# Patient Record
Sex: Male | Born: 1960 | Race: Black or African American | Hispanic: No | Marital: Married | Smoking: Never smoker
Health system: Southern US, Community
[De-identification: ages and names within clinical notes are randomized; demographics above are authoritative.]

## PROBLEM LIST (undated history)

## (undated) DIAGNOSIS — I509 Heart failure, unspecified: Secondary | ICD-10-CM

## (undated) DIAGNOSIS — E7800 Pure hypercholesterolemia, unspecified: Secondary | ICD-10-CM

## (undated) DIAGNOSIS — I10 Essential (primary) hypertension: Secondary | ICD-10-CM

## (undated) DIAGNOSIS — E119 Type 2 diabetes mellitus without complications: Secondary | ICD-10-CM

## (undated) HISTORY — PX: NO PAST SURGERIES: SHX2092

---
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
## 2009-05-10 ENCOUNTER — Ambulatory Visit (HOSPITAL_COMMUNITY): Admission: RE | Admit: 2009-05-10 | Discharge: 2009-05-10 | Payer: Self-pay | Admitting: Gastroenterology

## 2015-11-30 ENCOUNTER — Other Ambulatory Visit: Payer: Self-pay | Admitting: Gastroenterology

## 2016-01-01 ENCOUNTER — Encounter (HOSPITAL_COMMUNITY): Payer: Self-pay | Admitting: *Deleted

## 2016-01-03 ENCOUNTER — Encounter (HOSPITAL_COMMUNITY): Payer: Self-pay | Admitting: *Deleted

## 2016-01-06 ENCOUNTER — Encounter (HOSPITAL_COMMUNITY): Payer: Self-pay | Admitting: Anesthesiology

## 2016-01-06 NOTE — Anesthesia Preprocedure Evaluation (Addendum)
Anesthesia Evaluation  Patient identified by MRN, date of birth, ID band Patient awake    Reviewed: Allergy & Precautions, NPO status , Patient's Chart, lab work & pertinent test results  Airway Mallampati: III       Dental no notable dental hx. (+) Teeth Intact   Pulmonary  Snores, Daytime somnolence- ?undiagnosed OSA   Pulmonary exam normal breath sounds clear to auscultation       Cardiovascular hypertension, Pt. on medications Normal cardiovascular exam Rhythm:Regular Rate:Normal     Neuro/Psych negative neurological ROS  negative psych ROS   GI/Hepatic Neg liver ROS, Baseline screening colonoscopy   Endo/Other  diabetes, Well Controlled, Type 2, Oral Hypoglycemic AgentsHyperlipidemia  Renal/GU negative Renal ROS  negative genitourinary   Musculoskeletal negative musculoskeletal ROS (+)   Abdominal (+) + obese,   Peds  Hematology negative hematology ROS (+)   Anesthesia Other Findings   Reproductive/Obstetrics                            Anesthesia Physical Anesthesia Plan  ASA: II  Anesthesia Plan: MAC   Post-op Pain Management:    Induction: Intravenous  Airway Management Planned: Natural Airway, Nasal Cannula and Simple Face Mask  Additional Equipment:   Intra-op Plan:   Post-operative Plan:   Informed Consent: I have reviewed the patients History and Physical, chart, labs and discussed the procedure including the risks, benefits and alternatives for the proposed anesthesia with the patient or authorized representative who has indicated his/her understanding and acceptance.   Dental advisory given  Plan Discussed with: Anesthesiologist, CRNA and Surgeon  Anesthesia Plan Comments:        Anesthesia Quick Evaluation

## 2016-01-07 ENCOUNTER — Encounter (HOSPITAL_COMMUNITY): Payer: Self-pay

## 2016-01-07 ENCOUNTER — Ambulatory Visit (HOSPITAL_COMMUNITY)
Admission: RE | Admit: 2016-01-07 | Discharge: 2016-01-07 | Disposition: A | Discharge status: Home / Self Care | Payer: BC Managed Care – PPO | Source: Ambulatory Visit | Attending: Gastroenterology | Admitting: Gastroenterology

## 2016-01-07 ENCOUNTER — Ambulatory Visit (HOSPITAL_COMMUNITY): Payer: BC Managed Care – PPO | Admitting: Anesthesiology

## 2016-01-07 ENCOUNTER — Encounter (HOSPITAL_COMMUNITY)
Admission: RE | Disposition: A | Discharge status: Home / Self Care | Payer: Self-pay | Source: Ambulatory Visit | Attending: Gastroenterology

## 2016-01-07 DIAGNOSIS — Z1211 Encounter for screening for malignant neoplasm of colon: Secondary | ICD-10-CM | POA: Insufficient documentation

## 2016-01-07 DIAGNOSIS — E669 Obesity, unspecified: Secondary | ICD-10-CM | POA: Insufficient documentation

## 2016-01-07 DIAGNOSIS — Z6836 Body mass index (BMI) 36.0-36.9, adult: Secondary | ICD-10-CM | POA: Diagnosis not present

## 2016-01-07 DIAGNOSIS — E119 Type 2 diabetes mellitus without complications: Secondary | ICD-10-CM | POA: Insufficient documentation

## 2016-01-07 DIAGNOSIS — I10 Essential (primary) hypertension: Secondary | ICD-10-CM | POA: Diagnosis not present

## 2016-01-07 DIAGNOSIS — Z79899 Other long term (current) drug therapy: Secondary | ICD-10-CM | POA: Diagnosis not present

## 2016-01-07 DIAGNOSIS — Z7984 Long term (current) use of oral hypoglycemic drugs: Secondary | ICD-10-CM | POA: Diagnosis not present

## 2016-01-07 DIAGNOSIS — E7800 Pure hypercholesterolemia, unspecified: Secondary | ICD-10-CM | POA: Insufficient documentation

## 2016-01-07 DIAGNOSIS — Z7982 Long term (current) use of aspirin: Secondary | ICD-10-CM | POA: Diagnosis not present

## 2016-01-07 DIAGNOSIS — Z86010 Personal history of colonic polyps: Secondary | ICD-10-CM | POA: Insufficient documentation

## 2016-01-07 HISTORY — PX: COLONOSCOPY WITH PROPOFOL: SHX5780

## 2016-01-07 HISTORY — DX: Essential (primary) hypertension: I10

## 2016-01-07 HISTORY — DX: Type 2 diabetes mellitus without complications: E11.9

## 2016-01-07 HISTORY — DX: Pure hypercholesterolemia, unspecified: E78.00

## 2016-01-07 LAB — GLUCOSE, CAPILLARY: GLUCOSE-CAPILLARY: 137 mg/dL — AB (ref 65–99)

## 2016-01-07 SURGERY — COLONOSCOPY WITH PROPOFOL
Anesthesia: Monitor Anesthesia Care

## 2016-01-07 MED ORDER — PROPOFOL 10 MG/ML IV BOLUS
INTRAVENOUS | Status: AC
  Filled 2016-01-07: qty 40

## 2016-01-07 MED ORDER — SODIUM CHLORIDE 0.9 % IV SOLN: INTRAVENOUS | Status: DC

## 2016-01-07 MED ORDER — PROPOFOL 10 MG/ML IV BOLUS
INTRAVENOUS | Status: DC | PRN
  Administered 2016-01-07: 40 mg via INTRAVENOUS
  Administered 2016-01-07 (×7): 20 mg via INTRAVENOUS
  Administered 2016-01-07: 40 mg via INTRAVENOUS
  Administered 2016-01-07 (×6): 20 mg via INTRAVENOUS

## 2016-01-07 MED ORDER — LACTATED RINGERS IV SOLN
INTRAVENOUS | Status: DC
  Administered 2016-01-07: 1000 mL via INTRAVENOUS

## 2016-01-07 SURGICAL SUPPLY — 21 items

## 2016-01-07 NOTE — Discharge Instructions (Signed)

## 2016-01-07 NOTE — Transfer of Care (Signed)
Immediate Anesthesia Transfer of Care Note  Patient: Jeffrey Lewis  Procedure(s) Performed: Procedure(s): COLONOSCOPY WITH PROPOFOL (N/A)  Patient Location: PACU  Anesthesia Type:MAC  Level of Consciousness: sedated  Airway & Oxygen Therapy: Patient Spontanous Breathing and Patient connected to nasal cannula oxygen  Post-op Assessment: Report given to RN and Post -op Vital signs reviewed and stable  Post vital signs: Reviewed and stable  Last Vitals:  Vitals:   01/07/16 0622  BP: (!) 169/82  Pulse: 68  Resp: 19  Temp: 36.6 C    Last Pain:  Vitals:   01/07/16 0622  TempSrc: Oral         Complications: No apparent anesthesia complications

## 2016-01-07 NOTE — Op Note (Signed)
[facility] Patient Name: Jeffrey Lewis Procedure Date: 01/07/2016 MRN: [id number] Attending MD: Charolett Bumpers , MD Date of Birth: Apr 11, 1961 CSN: [id number] Age: 55 Admit Type: Outpatient Procedure:                Colonoscopy Indications:              High risk colon cancer surveillance: 05/10/2009                            colonoscopy was performed with removal of a 5 mm                            tubular adenomatous polyp Providers:                Charolett Bumpers, MD, Dwain Sarna, RN, [facility], Technician, Albertina Senegal. Alday CRNA, CRNA Referring MD:              Medicines:                Propofol per Anesthesia Complications:            No immediate complications. Estimated Blood Loss:     Estimated blood loss: none. Procedure:                Pre-Anesthesia Assessment:                           - Prior to the procedure, a History and Physical                            was performed, and patient medications and                            allergies were reviewed. The patient's tolerance of                            previous anesthesia was also reviewed. The risks                            and benefits of the procedure and the sedation                            options and risks were discussed with the patient.                            All questions were answered, and informed consent                            was obtained. Prior Anticoagulants: The patient                            last took aspirin 1 day prior to the procedure. ASA  Grade Assessment: II - A patient with mild systemic                            disease. After reviewing the risks and benefits,                            the patient was deemed in satisfactory condition to                            undergo the procedure.                           After obtaining informed consent, the colonoscope                            was  passed under direct vision. Throughout the                            procedure, the patient's blood pressure, pulse, and                            oxygen saturations were monitored continuously. The                            EC-[id number] ([id number]) scope was introduced through                            the anus and advanced to the the cecum, identified                            by appendiceal orifice and ileocecal valve. The                            colonoscopy was performed without difficulty. The                            patient tolerated the procedure well. The quality                            of the bowel preparation was good. The appendiceal                            orifice and the rectum were photographed. Scope In: 7:42:20 AM Scope Out: 7:57:50 AM Scope Withdrawal Time: 0 hours 11 minutes 1 second  Total Procedure Duration: 0 hours 15 minutes 30 seconds  Findings:      The perianal and digital rectal examinations were normal.      The entire examined colon appeared normal. Impression:               - The entire examined colon is normal.                           - No specimens collected. Moderate Sedation:      N/A- Per Anesthesia Care Recommendation:           -  Patient has a contact number available for                            emergencies. The signs and symptoms of potential                            delayed complications were discussed with the                            patient. Return to normal activities tomorrow.                            Written discharge instructions were provided to the                            patient.                           - Repeat colonoscopy in 5 years for surveillance.                           - Resume previous diet.                           - Continue present medications. Procedure Code(s):        --- Professional ---                           [id number], Colorectal cancer screening; colonoscopy on                             individual at high risk Diagnosis Code(s):        --- Professional ---                           Z86.010, Personal history of colonic polyps CPT copyright 2016 American Medical Association. All rights reserved. The codes documented in this report are preliminary and upon coder review may  be revised to meet current compliance requirements. Danise EdgeMartin Johnson, MD Charolett BumpersMartin K Johnson, MD 01/07/2016 8:03:59 AM This report has been signed electronically. Number of Addenda: 0

## 2016-01-07 NOTE — H&P (Signed)
  Procedure: Surveillance colonoscopy. 05/10/2009 colonoscopy was performed with removal of a 5 mm cecal tubular adenomatous polyp  History: The patient is a 55 year old male born 1960-08-31. He is scheduled to undergo a surveillance colonoscopy today.  Medication allergies: ACE inhibitors cause cough.  Past medical history: Type 2 diabetes mellitus. Hypercholesterolemia. Hypertension. Testosterone deficiency.  Exam: The patient is alert and lying comfortably on the endoscopy stretcher. Abdomen is soft and nontender to palpation. Cardiac exam reveals a regular rhythm. Lungs are clear to auscultation.  Plan: Proceed with surveillance colonoscopy

## 2016-01-07 NOTE — Anesthesia Postprocedure Evaluation (Signed)
Anesthesia Post Note  Patient: Jeffrey Lewis  Procedure(s) Performed: Procedure(s) (LRB): COLONOSCOPY WITH PROPOFOL (N/A)  Patient location during evaluation: PACU Anesthesia Type: MAC Level of consciousness: awake and alert and oriented Pain management: pain level controlled Vital Signs Assessment: post-procedure vital signs reviewed and stable Respiratory status: spontaneous breathing, nonlabored ventilation and respiratory function stable Cardiovascular status: stable and blood pressure returned to baseline Postop Assessment: no signs of nausea or vomiting Anesthetic complications: no    Last Vitals:  Vitals:   01/07/16 0622 01/07/16 0803  BP: (!) 169/82 (!) 149/97  Pulse: 68 82  Resp: 19 14  Temp: 36.6 C 36.4 C    Last Pain:  Vitals:   01/07/16 0803  TempSrc: Oral                 Ting Cage A.

## 2016-01-08 ENCOUNTER — Encounter (HOSPITAL_COMMUNITY): Payer: Self-pay | Admitting: Gastroenterology

## 2017-11-02 ENCOUNTER — Ambulatory Visit
Admission: RE | Admit: 2017-11-02 | Discharge: 2017-11-02 | Disposition: A | Discharge status: Home / Self Care | Payer: BC Managed Care – PPO | Source: Ambulatory Visit | Attending: Physician Assistant | Admitting: Physician Assistant

## 2017-11-02 ENCOUNTER — Other Ambulatory Visit: Payer: Self-pay | Admitting: Physician Assistant

## 2017-11-02 DIAGNOSIS — R600 Localized edema: Secondary | ICD-10-CM

## 2017-11-02 DIAGNOSIS — R05 Cough: Secondary | ICD-10-CM

## 2017-11-02 DIAGNOSIS — R059 Cough, unspecified: Secondary | ICD-10-CM

## 2017-11-02 DIAGNOSIS — R0602 Shortness of breath: Secondary | ICD-10-CM

## 2017-11-10 ENCOUNTER — Ambulatory Visit (HOSPITAL_COMMUNITY): Payer: BC Managed Care – PPO

## 2017-11-10 ENCOUNTER — Ambulatory Visit (HOSPITAL_COMMUNITY): Payer: BC Managed Care – PPO | Attending: Cardiovascular Disease

## 2017-11-10 ENCOUNTER — Other Ambulatory Visit: Payer: Self-pay

## 2017-11-10 ENCOUNTER — Encounter: Payer: Self-pay | Admitting: Cardiovascular Disease

## 2017-11-10 ENCOUNTER — Telehealth: Payer: Self-pay

## 2017-11-10 ENCOUNTER — Ambulatory Visit: Payer: BC Managed Care – PPO | Admitting: Cardiovascular Disease

## 2017-11-10 ENCOUNTER — Other Ambulatory Visit (HOSPITAL_COMMUNITY): Payer: Self-pay | Admitting: Family Medicine

## 2017-11-10 VITALS — BP 110/94 | HR 95 | Ht 71.0 in | Wt 273.0 lb

## 2017-11-10 DIAGNOSIS — I10 Essential (primary) hypertension: Secondary | ICD-10-CM | POA: Diagnosis not present

## 2017-11-10 DIAGNOSIS — R609 Edema, unspecified: Secondary | ICD-10-CM | POA: Insufficient documentation

## 2017-11-10 DIAGNOSIS — I5023 Acute on chronic systolic (congestive) heart failure: Secondary | ICD-10-CM | POA: Diagnosis not present

## 2017-11-10 LAB — HEPATIC FUNCTION PANEL
ALT: 47 IU/L — AB (ref 0–44)
AST: 24 IU/L (ref 0–40)
Albumin: 4.4 g/dL (ref 3.5–5.5)
Alkaline Phosphatase: 66 IU/L (ref 39–117)
Bilirubin Total: 0.7 mg/dL (ref 0.0–1.2)
Bilirubin, Direct: 0.22 mg/dL (ref 0.00–0.40)
TOTAL PROTEIN: 6.7 g/dL (ref 6.0–8.5)

## 2017-11-10 LAB — BASIC METABOLIC PANEL
BUN/Creatinine Ratio: 12 (ref 9–20)
BUN: 15 mg/dL (ref 6–24)
CO2: 20 mmol/L (ref 20–29)
Calcium: 9.3 mg/dL (ref 8.7–10.2)
Chloride: 105 mmol/L (ref 96–106)
Creatinine, Ser: 1.29 mg/dL — ABNORMAL HIGH (ref 0.76–1.27)
GFR calc non Af Amer: 61 mL/min/{1.73_m2} (ref >59)
GFR, EST AFRICAN AMERICAN: 70 mL/min/{1.73_m2} (ref >59)
Glucose: 162 mg/dL — ABNORMAL HIGH (ref 65–99)
POTASSIUM: 4.2 mmol/L (ref 3.5–5.2)
Sodium: 142 mmol/L (ref 134–144)

## 2017-11-10 LAB — LIPID PANEL
Chol/HDL Ratio: 4 ratio (ref 0.0–5.0)
Cholesterol, Total: 172 mg/dL (ref 100–199)
HDL: 43 mg/dL (ref >39)
LDL CALC: 110 mg/dL — AB (ref 0–99)
Triglycerides: 95 mg/dL (ref 0–149)
VLDL CHOLESTEROL CAL: 19 mg/dL (ref 5–40)

## 2017-11-10 LAB — CBC
HEMATOCRIT: 45.4 % (ref 37.5–51.0)
Hemoglobin: 15.1 g/dL (ref 13.0–17.7)
MCH: 29.6 pg (ref 26.6–33.0)
MCHC: 33.3 g/dL (ref 31.5–35.7)
MCV: 89 fL (ref 79–97)
Platelets: 218 10*3/uL (ref 150–450)
RBC: 5.1 x10E6/uL (ref 4.14–5.80)
RDW: 14.6 % (ref 12.3–15.4)
WBC: 5.2 10*3/uL (ref 3.4–10.8)

## 2017-11-10 LAB — TSH: TSH: 1.95 u[IU]/mL (ref 0.450–4.500)

## 2017-11-10 MED ORDER — POTASSIUM CHLORIDE ER 10 MEQ PO TBCR: 10.0000 meq | EXTENDED_RELEASE_TABLET | Freq: Every day | ORAL | 3 refills | Status: DC

## 2017-11-10 MED ORDER — TORSEMIDE 20 MG PO TABS: 40.0000 mg | ORAL_TABLET | Freq: Every day | ORAL | 11 refills | Status: DC

## 2017-11-10 MED ORDER — SACUBITRIL-VALSARTAN 24-26 MG PO TABS: 1.0000 | ORAL_TABLET | Freq: Two times a day (BID) | ORAL | 11 refills | Status: DC

## 2017-11-10 NOTE — Telephone Encounter (Signed)
I have done an [place] PA through covermymeds. Key: [id number]

## 2017-11-10 NOTE — Patient Instructions (Addendum)
Medication Instructions:  Your physician has recommended you make the following change in your medication:   STOP Amlodipine (Norvasc) STOP Furosemide (Lasix) START Torsemide (Demadex) 40 mg once daily - take in the morning START Entresto (Valsartan/Sacubitril) 24-26 mg twice daily - morning and evening START Kdur (potassium supplement) 10 mEq once daily - take in the morning   Labwork: TODAY - BMET, CBC, TSH, Liver panel, cholesterol  Your physician recommends that you return for lab work at next appointment - BMET. You do not have to fast for this appointment.   Testing/Procedures: None Ordered   Follow-Up: Your physician recommends that you return for a follow-up appointment on Tuesday July 30 at 10:40 am    If you need a refill on your cardiac medications before your next appointment, please call your pharmacy.   Thank you for choosing [facility]! Eligha Bridegroom, RN [phone]   Heart Failure Action Plan A heart failure action plan helps you understand what to do when you have symptoms of heart failure. Follow the plan that was created by you and your health care provider. Review your plan each time you visit your health care provider. Red zone These signs and symptoms mean you should get medical help right away:  You have trouble breathing when resting.  You have a dry cough that is getting worse.  You have swelling or pain in your legs or abdomen that is getting worse.  You suddenly gain more than 2-3 lb (0.9-1.4 kg) in a day, or more than 5 lb (2.3 kg) in one week. This amount may be more or less depending on your condition.  You have trouble staying awake or you feel confused.  You have chest pain.  You do not have an appetite.  You pass out.  If you experience any of these symptoms:  Call your local emergency services (911 in the [place] right away or seek help at the emergency department of the nearest hospital.  Yellow zone These signs and  symptoms mean your condition may be getting worse and you should make some changes:  You have trouble breathing when you are active or you need to sleep with extra pillows.  You have swelling in your legs or abdomen.  You gain 2-3 lb (0.9-1.4 kg) in one day, or 5 lb (2.3 kg) in one week. This amount may be more or less depending on your condition.  You get tired easily.  You have trouble sleeping.  You have a dry cough.  If you experience any of these symptoms:  Contact your health care provider within the next day.  Your health care provider may adjust your medicines.  Green zone These signs mean you are doing well and can continue what you are doing:  You do not have shortness of breath.  You have very little swelling or no new swelling.  Your weight is stable (no gain or loss).  You have a normal activity level.  You do not have chest pain or any other new symptoms.  Follow these instructions at home:  Take over-the-counter and prescription medicines only as told by your health care provider.  Weigh yourself daily. Your target weight is __________ lb (__________ kg). ? Call your health care provider if you gain more than __________ lb (__________ kg) in a day, or more than __________ lb (__________ kg) in one week.  Eat a heart-healthy diet. Work with a diet and nutrition specialist (dietitian) to create an eating plan that is best for  you.  Keep all follow-up visits as told by your health care provider. This is important. Where to find more information:  American Heart Association: www.heart.org Summary  Follow the action plan that was created by you and your health care provider.  Get help right away if you have any symptoms in the Red zone. This information is not intended to replace advice given to you by your health care provider. Make sure you discuss any questions you have with your health care provider. Document Released: 05/24/2016 Document Revised:  05/24/2016 Document Reviewed: 05/24/2016 Elsevier Interactive Patient Education  [employer].

## 2017-11-10 NOTE — Progress Notes (Signed)
Cardiology Office Note:    Date:  11/10/2017   ID:  Jeffrey Lewis, DOB 08/12/1960, MRN [id number]  PCP:  Blair Heys, MD  Cardiologist:  Kristeen Miss, MD   Referring MD: Blair Heys, MD   Chief Complaint  Patient presents with  . Congestive Heart Failure     History of Present Illness:    Jeffrey Lewis is a 57 y.o. male with a hx of hypertension, obesity, hyperlipidemia.  He had episodes of shortness of breath that seem to be worsening.  He was scheduled for an echocardiogram.  The echo tech noticed that his ejection fraction was markedly reduced and he was worked into my schedule for further evaluation.  Several months of progressive dyspnea.     Worse several weeks ago  Developed a cough.  Saw [name] who ordered an echo  Has ocasional 2 pillow orthopnea.     Has PND  Significant DOE . 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  On meds for several years.  Still eats some salty foods.   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  He does not know if he has apneic episodes. Does not have early morning somnolence.  Is have some sleepiness associated with some narcotic cough syrup that he received recently.  Was just started on Furosemide   A day  - doesn't seem to be helping much    Past Medical History:  Diagnosis Date  . Diabetes mellitus without complication (HCC)   . Elevated cholesterol   . Hypertension     Past Surgical History:  Procedure Laterality Date  . COLONOSCOPY WITH PROPOFOL N/A 01/07/2016   Procedure: COLONOSCOPY WITH PROPOFOL;  Surgeon: Charolett Bumpers, MD;  Location: [facility] END[facility]COPY;  Service: Endoscopy;  Laterality: N/A;  . NO PAST SURGERIES       Current Medications: Current Meds  Medication Sig  . aspirin EC 81 MG tablet Take 81 mg by mouth every evening.  Marland Kitchen atorvastatin (LIPITOR) 40 MG tablet Take 40 mg by mouth every evening.  . metFORMIN (GLUCOPHAGE) 500 MG tablet Take by mouth 2 (two) times daily with a meal.  . UNABLE TO FIND ploglitazome 30 mg daily in pm  . [DISCONTINUED] amLODipine (NORVASC) 5 MG tablet Take 5 mg by mouth every evening.     Allergies:   Patient has no allergy information on record.   Social History   Socioeconomic History  . Marital status: Married    Spouse name: Not on file  . Number of children: Not on file  . Years of education: Not on file  . Highest education level: Not on file  Occupational History  . Not on file  Social Needs  . Financial resource strain: Not on file  . Food insecurity:    Worry: Not on file    Inability: Not on file  . Transportation needs:    Medical: Not on file    Non-medical: Not on file  Tobacco Use  . Smoking status: Never Smoker  . Smokeless tobacco: Never Used  Substance and Sexual Activity  . Alcohol use: Yes    Comment: 1 beer  per day  . Drug use: No  . Sexual activity: Yes  Lifestyle  . Physical activity:    Days per week: Not on file    Minutes per session: Not on file  . Stress: Not on file  Relationships  . Social connections:    Talks on phone: Not on file    Gets together: Not on file    Attends religious service: Not on file    Active member of club or organization: Not on file    Attends meetings of clubs or organizations: Not on file    Relationship status: Not on file  Other Topics Concern  . Not on file  Social History Narrative  . Not on file     Family History: The patient's family history includes CAD in his brother; Hypertension in his father and mother.  ROS:   Please see the history of present illness.     All other systems reviewed and are negative.  EKGs/Labs/Other Studies Reviewed:    The following studies  were reviewed today:    EKG:   November 10, 2017.   NSR at 95,  1st degree AV block pulmonary disease pattern   Recent Labs: 11/10/2017: ALT 47; BUN 15; Creatinine, Ser 1.29; Hemoglobin 15.1; Platelets 218; Potassium 4.2; Sodium 142; TSH 1.950  Recent Lipid Panel    Component Value Date/Time   CHOL 172 11/10/2017 1153   TRIG 95 11/10/2017 1153   HDL 43 11/10/2017 1153   CHOLHDL 4.0 11/10/2017 1153   LDLCALC 110 (H) 11/10/2017 1153    Physical Exam:    VS:  BP (!) 110/94   Pulse 95   Ht 5\' 11"  (1.803 m)   Wt 273 lb (123.8 kg)   SpO2 97%   BMI 38.08 kg/m     Wt Readings from Last 3 Encounters:  11/10/17 273 lb (123.8 kg)  01/07/16 260 lb (117.9 kg)     GEN:   Middle age male,  NAD ,   obese HEENT: Normal NECK: No JVD; No carotid bruits LYMPHATICS: No lymphadenopathy CARDIAC:   RR , S1 S2, + S3  No significant murmur  RESPIRATORY:  Clear to auscultation without rales, wheezing or rhonchi  ABDOMEN: Soft, non-tender, non-distended MUSCULOSKELETAL:  No edema; No deformity  SKIN: Warm and dry NEUROLOGIC:  Alert and oriented x 3 PSYCHIATRIC:  Normal affect   ASSESSMENT:    1. Acute on chronic systolic heart failure (HCC)   2. Essential hypertension    PLAN:    In order of problems listed above:  1. Acute on chronic combined CHF:   Jeffrey Lewis presents with several months of progressive heart failure symptoms.  Echocardiogram today shows an ejection fraction of around 15%.  His blood pressure has been fairly well-controlled recently.  He is currently on amlodipine.  We will discontinue the amlodipine and start him on Entresto 24-26 mg twice a day.  Discontinue the furosemide and start him on torsemide 40 mg a day.  We will check labs today including basic metabolic profile, TSH, CBC, liver enzymes, lipid profile.  I will see him back in 1/2 weeks for follow-up visit.  We will check a basic metabolic profile at that time.  Advised him to weigh himself every day.  2.  HTN:   He  was found to have acute on chronic combined systolic and diastolic congestive heart failure.  We will discontinue the amlodipine we will start him on Entresto 24--26 mg twice a day.  3.  Diabetes mellitus: His fasting glucose level at his primary care's office was around 250.  He needs to pay better attention to his diet.  He may be a good candidate for Jardiance.   Medication Adjustments/Labs and Tests Ordered: Current medicines are reviewed at length with the patient today.  Concerns regarding medicines are outlined above.  Orders Placed This Encounter  Procedures  . CBC  . Basic Metabolic Panel (BMET)  . Lipid Profile  . Hepatic function panel  . TSH  . EKG 12-Lead   Meds ordered this encounter  Medications  . sacubitril-valsartan (ENTRESTO) 24-26 MG    Sig: Take 1 tablet by mouth 2 (two) times daily.    Dispense:  60 tablet    Refill:  11  . torsemide (DEMADEX) 20 MG tablet    Sig: Take 2 tablets (40 mg total) by mouth daily.    Dispense:  60 tablet    Refill:  11  . potassium chloride (K-DUR) 10 MEQ tablet    Sig: Take 1 tablet (10 mEq total) by mouth daily.    Dispense:  90 tablet    Refill:  3     Patient Instructions  Medication Instructions:  Your physician has recommended you make the following change in your medication:   STOP Amlodipine (Norvasc) STOP Furosemide (Lasix) START Torsemide (Demadex) 40 mg once daily - take in the morning START Entresto (Valsartan/Sacubitril) 24-26 mg twice daily - morning and evening START Kdur (potassium supplement) 10 mEq once daily - take in the morning   Labwork: TODAY - BMET, CBC, TSH, Liver panel, cholesterol  Your physician recommends that you return for lab work at next appointment - BMET. You do not have to fast for this appointment.   Testing/Procedures: None Ordered   Follow-Up: Your physician recommends that you return for a follow-up appointment on Tuesday July 30 at 10:40 am    If you need a refill on  your cardiac medications before your next appointment, please call your pharmacy.   Thank you for choosing [facility]! Eligha Bridegroom, RN [phone]   Heart Failure Action Plan A heart failure action plan helps you understand what to do when you have symptoms of heart failure. Follow the plan that was created by you and your health care provider. Review your plan each time you visit your health care provider. Red zone These signs and symptoms mean you should get medical help right away:  You have trouble breathing when resting.  You have a dry cough that is getting worse.  You have swelling or pain in your legs or abdomen that is getting worse.  You suddenly gain more than 2-3 lb (0.9-1.4 kg) in a day, or more than 5 lb (2.3 kg) in one week. This amount may be more or less depending on your condition.  You have trouble staying awake or you feel confused.  You have chest pain.  You do not have an appetite.  You pass out.  If you experience any of these symptoms:  Call your local emergency services (911 in the [place] right away or seek help at the emergency department of the nearest hospital.  Yellow zone These signs and symptoms mean your condition may be getting worse and you should make some changes:  You have trouble breathing when you are active or you need to sleep with extra pillows.  You have swelling in your legs or abdomen.  You gain 2-3 lb (0.9-1.4 kg) in one day, or 5 lb (2.3  kg) in one week. This amount may be more or less depending on your condition.  You get tired easily.  You have trouble sleeping.  You have a dry cough.  If you experience any of these symptoms:  Contact your health care provider within the next day.  Your health care provider may adjust your medicines.  Green zone These signs mean you are doing well and can continue what you are doing:  You do not have shortness of breath.  You have very little swelling or no new  swelling.  Your weight is stable (no gain or loss).  You have a normal activity level.  You do not have chest pain or any other new symptoms.  Follow these instructions at home:  Take over-the-counter and prescription medicines only as told by your health care provider.  Weigh yourself daily. Your target weight is __________ lb (__________ kg). ? Call your health care provider if you gain more than __________ lb (__________ kg) in a day, or more than __________ lb (__________ kg) in one week.  Eat a heart-healthy diet. Work with a diet and nutrition specialist (dietitian) to create an eating plan that is best for you.  Keep all follow-up visits as told by your health care provider. This is important. Where to find more information:  American Heart Association: www.heart.org Summary  Follow the action plan that was created by you and your health care provider.  Get help right away if you have any symptoms in the Red zone. This information is not intended to replace advice given to you by your health care provider. Make sure you discuss any questions you have with your health care provider. Document Released: 05/24/2016 Document Revised: 05/24/2016 Document Reviewed: 05/24/2016 Elsevier Interactive Patient Education  2018 [employer].      Signed, Kristeen Miss, MD  11/10/2017 6:20 PM    [facility]

## 2017-11-11 NOTE — Telephone Encounter (Signed)
Letter received via fax from [facility] stating that they have approved the pts [place] PA. Approval good from 11/10/2017 until 11/11/2018.  I have notified the pts pharmacy.

## 2017-11-24 ENCOUNTER — Ambulatory Visit: Payer: BC Managed Care – PPO | Admitting: Cardiovascular Disease

## 2017-11-24 ENCOUNTER — Encounter: Payer: Self-pay | Admitting: Cardiovascular Disease

## 2017-11-24 ENCOUNTER — Encounter (INDEPENDENT_AMBULATORY_CARE_PROVIDER_SITE_OTHER): Payer: Self-pay

## 2017-11-24 VITALS — BP 118/80 | HR 97 | Ht 71.0 in | Wt 266.4 lb

## 2017-11-24 DIAGNOSIS — I5043 Acute on chronic combined systolic (congestive) and diastolic (congestive) heart failure: Secondary | ICD-10-CM

## 2017-11-24 LAB — BASIC METABOLIC PANEL
BUN / CREAT RATIO: 17 (ref 9–20)
BUN: 24 mg/dL (ref 6–24)
CO2: 22 mmol/L (ref 20–29)
CREATININE: 1.38 mg/dL — AB (ref 0.76–1.27)
Calcium: 9.3 mg/dL (ref 8.7–10.2)
Chloride: 100 mmol/L (ref 96–106)
GFR, EST AFRICAN AMERICAN: 65 mL/min/{1.73_m2} (ref >59)
GFR, EST NON AFRICAN AMERICAN: 56 mL/min/{1.73_m2} — AB (ref >59)
Glucose: 215 mg/dL — ABNORMAL HIGH (ref 65–99)
Potassium: 3.9 mmol/L (ref 3.5–5.2)
Sodium: 140 mmol/L (ref 134–144)

## 2017-11-24 MED ORDER — TORSEMIDE 20 MG PO TABS: 20.0000 mg | ORAL_TABLET | Freq: Every day | ORAL | 3 refills | Status: DC

## 2017-11-24 MED ORDER — CARVEDILOL 3.125 MG PO TABS: 3.1250 mg | ORAL_TABLET | Freq: Two times a day (BID) | ORAL | 11 refills | Status: DC

## 2017-11-24 NOTE — Patient Instructions (Addendum)
Medication Instructions:  Your physician has recommended you make the following change in your medication:   DECREASE Torsemide (Demadex) to 20 mg once daily START Carvedilol (Coreg) 3.125 mg twice daily - take 12 hours apart   Labwork: TODAY - basic metabolic panel   Testing/Procedures: None Ordered   Follow-Up: Your physician recommends that you return for a follow-up appointment on Tuesday August 27 at 10:20    If you need a refill on your cardiac medications before your next appointment, please call your pharmacy.   Thank you for choosing [facility]! Eligha Bridegroom, RN [phone]

## 2017-11-24 NOTE — Progress Notes (Signed)
Cardiology Office Note:    Date:  11/24/2017   ID:  Jeffrey Lewis, DOB Aug 19, 1960, MRN [id number]  PCP:  Blair Heys, MD  Cardiologist:  Kristeen Miss, MD   Referring MD: Blair Heys, MD   Chief Complaint  Patient presents with  . Congestive Heart Failure     History of Present Illness:    Jeffrey Lewis is a 57 y.o. male with a hx of hypertension, obesity, hyperlipidemia.  He had episodes of shortness of breath that seem to be worsening.  He was scheduled for an echocardiogram.  The echo tech noticed that his ejection fraction was markedly reduced and he was worked into my schedule for further evaluation.  Several months of progressive dyspnea.     Worse several weeks ago  Developed a cough.  Saw [name] who ordered an echo  Has ocasional 2 pillow orthopnea.     Has PND  Significant DOE . 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  On meds for several years.  Still eats some salty foods.   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  He does not know if he has apneic episodes. Does not have early morning somnolence.  Is have some sleepiness associated with some narcotic cough syrup that he received recently.  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  He has been on torsemide.  We started [facility] during his last office visit.  Does not eat much salty foods.   Leg swelling has resolved.   Been found to have an elevated PSA level.  There is a chance that he will need a prostate biopsy.  Wt today = 266 Wt on JUly 16 was 273  lbs.   Past Medical History:  Diagnosis Date  . Diabetes mellitus without complication (HCC)   . Elevated cholesterol   . Hypertension     Past Surgical History:  Procedure Laterality Date  . COLONOSCOPY WITH PROPOFOL N/A 01/07/2016   Procedure: COLONOSCOPY WITH PROPOFOL;  Surgeon: Charolett Bumpers, MD;  Location: [facility];  Service: Endoscopy;  Laterality: N/A;  . NO PAST SURGERIES      Current Medications: Current Meds  Medication Sig  . aspirin EC 81 MG tablet Take 81 mg by mouth every evening.  Marland Kitchen atorvastatin (LIPITOR) 40 MG tablet Take 40 mg by mouth every evening.  . metFORMIN (GLUCOPHAGE) 500 MG tablet Take by mouth 2 (two) times daily with a meal.  . potassium chloride (K-DUR) 10 MEQ tablet Take 1 tablet (10 mEq total) by mouth daily.  . sacubitril-valsartan (ENTRESTO) 24-26 MG Take 1 tablet by mouth 2 (two) times daily.  Marland Kitchen UNABLE TO FIND ploglitazome 30 mg daily in pm  . [DISCONTINUED] torsemide (DEMADEX) 20 MG tablet Take 2 tablets (40 mg total) by mouth daily.     Allergies:   Patient has no allergy information on record.   Social History   Socioeconomic History  . Marital status: Married  Spouse name: Not on file  . Number of children: Not on file  . Years of education: Not on file  . Highest education level: Not on file  Occupational History  . Not on file  Social Needs  . Financial resource strain: Not on file  . Food insecurity:    Worry: Not on file    Inability: Not on file  . Transportation needs:    Medical: Not on file    Non-medical: Not on file  Tobacco Use  . Smoking status: Never Smoker  . Smokeless tobacco: Never Used  Substance and Sexual Activity  . Alcohol use: Yes    Comment: 1 beer per day  . Drug use: No  . Sexual activity: Yes  Lifestyle  . Physical activity:    Days per week: Not on file    Minutes per session: Not on file  . Stress: Not on file  Relationships  . Social connections:    Talks on phone: Not on file     Gets together: Not on file    Attends religious service: Not on file    Active member of club or organization: Not on file    Attends meetings of clubs or organizations: Not on file    Relationship status: Not on file  Other Topics Concern  . Not on file  Social History Narrative  . Not on file     Family History: The patient's family history includes CAD in his brother; Hypertension in his father and mother.  ROS:   Please see the history of present illness.     All other systems reviewed and are negative.  EKGs/Labs/Other Studies Reviewed:    The following studies were reviewed today:    EKG:      Recent Labs: 11/10/2017: ALT 47; BUN 15; Creatinine, Ser 1.29; Hemoglobin 15.1; Platelets 218; Potassium 4.2; Sodium 142; TSH 1.950  Recent Lipid Panel    Component Value Date/Time   CHOL 172 11/10/2017 1153   TRIG 95 11/10/2017 1153   HDL 43 11/10/2017 1153   CHOLHDL 4.0 11/10/2017 1153   LDLCALC 110 (H) 11/10/2017 1153    Physical Exam:     Physical Exam: Blood pressure 118/80, pulse 97, height 5\' 11"  (1.803 m), weight 266 lb 6.4 oz (120.8 kg), SpO2 97 %.  GEN:  Well nourished, well developed in no acute distress HEENT: Normal NECK: No JVD; No carotid bruits LYMPHATICS: No lymphadenopathy CARDIAC: RR, no murmurs, rubs, gallops RESPIRATORY:  Clear to auscultation without rales, wheezing or rhonchi  ABDOMEN: Soft, non-tender, non-distended MUSCULOSKELETAL:  No edema; No deformity  SKIN: Warm and dry NEUROLOGIC:  Alert and oriented x 3   ASSESSMENT:    1. Acute on chronic combined systolic and diastolic CHF (congestive heart failure) (HCC)    PLAN:    In order of problems listed above:  Acute on chronic combined CHF:    [name] is feeling much better.  He is diuresed 7 pounds over the past 2 weeks.  He is tolerating the Entresto fairly well.  His heart rate is on the higher end of normal.  We will start him on carvedilol 3.125 mg twice a day.  We will decrease  the torsemide to 20 mg a day.  We will check a basic metabolic profile today.  I will see him again in 3 to 4 weeks and will check a basic metabolic profile at that time.  Anticipate increasing the Entresto at that time.   3.  Diabetes mellitus: His fasting glucose level at his primary care's office was around 250.  He needs to pay better attention to his diet.  He may be a good candidate for Jardiance.   Medication Adjustments/Labs and Tests Ordered: Current medicines are reviewed at length with the patient today.  Concerns regarding medicines are outlined above.  Orders Placed This Encounter  Procedures  . Basic Metabolic Panel (BMET)   Meds ordered this encounter  Medications  . carvedilol (COREG) 3.125 MG tablet    Sig: Take 1 tablet (3.125 mg total) by mouth 2 (two) times daily.    Dispense:  60 tablet    Refill:  11  . torsemide (DEMADEX) 20 MG tablet    Sig: Take 1 tablet (20 mg total) by mouth daily.    Dispense:  90 tablet    Refill:  3     Patient Instructions  Medication Instructions:  Your physician has recommended you make the following change in your medication:   DECREASE Torsemide (Demadex) to 20 mg once daily START Carvedilol (Coreg) 3.125 mg twice daily - take 12 hours apart   Labwork: TODAY - basic metabolic panel   Testing/Procedures: None Ordered   Follow-Up: Your physician recommends that you return for a follow-up appointment on Tuesday August 27 at 10:20    If you need a refill on your cardiac medications before your next appointment, please call your pharmacy.   Thank you for choosing [facility]! Eligha Bridegroom, RN [phone]       Signed, Kristeen Miss, MD  11/24/2017 11:53 AM    [facility]

## 2017-12-11 DIAGNOSIS — E119 Type 2 diabetes mellitus without complications: Secondary | ICD-10-CM | POA: Insufficient documentation

## 2017-12-22 ENCOUNTER — Encounter (INDEPENDENT_AMBULATORY_CARE_PROVIDER_SITE_OTHER): Payer: Self-pay

## 2017-12-22 ENCOUNTER — Ambulatory Visit: Payer: BC Managed Care – PPO | Admitting: Cardiovascular Disease

## 2017-12-22 ENCOUNTER — Encounter: Payer: Self-pay | Admitting: Cardiovascular Disease

## 2017-12-22 VITALS — BP 114/72 | HR 88 | Ht 71.0 in | Wt 268.0 lb

## 2017-12-22 DIAGNOSIS — I5043 Acute on chronic combined systolic (congestive) and diastolic (congestive) heart failure: Secondary | ICD-10-CM

## 2017-12-22 LAB — BASIC METABOLIC PANEL
BUN / CREAT RATIO: 15 (ref 9–20)
BUN: 22 mg/dL (ref 6–24)
CO2: 23 mmol/L (ref 20–29)
CREATININE: 1.49 mg/dL — AB (ref 0.76–1.27)
Calcium: 9.7 mg/dL (ref 8.7–10.2)
Chloride: 96 mmol/L (ref 96–106)
GFR, EST AFRICAN AMERICAN: 59 mL/min/{1.73_m2} — AB (ref >59)
GFR, EST NON AFRICAN AMERICAN: 51 mL/min/{1.73_m2} — AB (ref >59)
GLUCOSE: 202 mg/dL — AB (ref 65–99)
Potassium: 4 mmol/L (ref 3.5–5.2)
SODIUM: 137 mmol/L (ref 134–144)

## 2017-12-22 MED ORDER — CARVEDILOL 6.25 MG PO TABS: 6.2500 mg | ORAL_TABLET | Freq: Two times a day (BID) | ORAL | 3 refills | Status: DC

## 2017-12-22 NOTE — Patient Instructions (Addendum)
Medication Instructions:  1) INCREASE Carvedilol to 6.25mg  twice daily  Labwork: BMET today  Testing/Procedures: None  Follow-Up: Your physician recommends that you schedule a follow-up appointment in: 6 weeks with Dr. Elease Hashimoto. (Can have 10/4 at 11:40A)   Any Other Special Instructions Will Be Listed Below (If Applicable).     If you need a refill on your cardiac medications before your next appointment, please call your pharmacy.

## 2017-12-22 NOTE — Progress Notes (Signed)
Cardiology Office Note:    Date:  12/22/2017   ID:  Jeffrey Lewis, DOB 1961-01-03, MRN [id number]  PCP:  [place], MD  Cardiologist:  Kristeen Miss, MD   Referring MD: Blair Heys, MD   Chief Complaint  Patient presents with  . Congestive Heart Failure         Jeffrey Lewis is a 57 y.o. male with a hx of hypertension, obesity, hyperlipidemia.  He had episodes of shortness of breath that seem to be worsening.  He was scheduled for an echocardiogram.  The echo tech noticed that his ejection fraction was markedly reduced and he was worked into my schedule for further evaluation.  Several months of progressive dyspnea.     Worse several weeks ago  Developed a cough.  Saw [name] who ordered an echo  Has ocasional 2 pillow orthopnea.     Has PND  Significant DOE . 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  On meds for several years.  Still eats some salty foods.   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  He does not know if he has apneic episodes. Does not have early morning somnolence.  Is have some sleepiness associated with some narcotic cough syrup that he received recently.  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  He has been on torsemide.  We started [facility] during his last office visit.  Does not eat much salty foods.   Leg swelling has resolved.   Been found to have an elevated PSA level.  There is a chance that he will need a prostate biopsy.  Wt today = 266 Wt on JUly 16 was 273 lbs.   December 22, 2017:  Jeffrey Lewis seen today for follow-up of his acute on chronic combined systolic and diastolic congestive heart failure. His weight today is 268 pounds.  He is tolerating Entresto fairly well.  We have started him on low-dose carvedilol. Feeling better and better.   Past Medical History:  Diagnosis Date  . Diabetes mellitus without complication (HCC)   . Elevated cholesterol   . Hypertension     Past Surgical History:  Procedure Laterality Date  . COLONOSCOPY WITH PROPOFOL N/A 01/07/2016   Procedure: COLONOSCOPY WITH PROPOFOL;  Surgeon: Charolett Bumpers, MD;  Location: [facility];  Service: Endoscopy;  Laterality: N/A;  . NO PAST SURGERIES      Current Medications: Current Meds  Medication Sig  . aspirin EC 81 MG tablet Take 81 mg by mouth every evening.  Marland Kitchen atorvastatin (LIPITOR) 40 MG tablet Take 40 mg by mouth every evening.  . metFORMIN (GLUCOPHAGE) 500 MG tablet Take by mouth 2 (two) times daily with a meal.  . potassium chloride (K-DUR) 10 MEQ tablet Take 1 tablet (10 mEq total) by mouth daily.  . sacubitril-valsartan (ENTRESTO) 24-26 MG Take 1 tablet by mouth 2 (two) times daily.  Marland Kitchen torsemide (DEMADEX) 20 MG tablet Take 1 tablet (20 mg  total) by mouth daily.  Marland Kitchen UNABLE TO FIND ploglitazome 30 mg daily in pm  . [DISCONTINUED] carvedilol (COREG) 3.125 MG tablet Take 1 tablet (3.125 mg total) by mouth 2 (two) times daily.     Allergies:   Patient has no allergy information on record.   Social History   Socioeconomic History  . Marital status: Married    Spouse name: Not on file  . Number of children: Not on file  . Years of education: Not on file  . Highest education level: Not on file  Occupational History  . Not on file  Social Needs  . Financial resource strain: Not on file  . Food insecurity:    Worry: Not on file    Inability: Not on file  . Transportation needs:    Medical: Not on file    Non-medical: Not on file  Tobacco Use  . Smoking status: Never  Smoker  . Smokeless tobacco: Never Used  Substance and Sexual Activity  . Alcohol use: Yes    Comment: 1 beer per day  . Drug use: No  . Sexual activity: Yes  Lifestyle  . Physical activity:    Days per week: Not on file    Minutes per session: Not on file  . Stress: Not on file  Relationships  . Social connections:    Talks on phone: Not on file    Gets together: Not on file    Attends religious service: Not on file    Active member of club or organization: Not on file    Attends meetings of clubs or organizations: Not on file    Relationship status: Not on file  Other Topics Concern  . Not on file  Social History Narrative  . Not on file     Family History: The patient's family history includes CAD in his brother; Hypertension in his father and mother.  ROS:   Please see the history of present illness.     All other systems reviewed and are negative.  EKGs/Labs/Other Studies Reviewed:    The following studies were reviewed today:    EKG:      Recent Labs: 11/10/2017: ALT 47; Hemoglobin 15.1; Platelets 218; TSH 1.950 11/24/2017: BUN 24; Creatinine, Ser 1.38; Potassium 3.9; Sodium 140  Recent Lipid Panel    Component Value Date/Time   CHOL 172 11/10/2017 1153   TRIG 95 11/10/2017 1153   HDL 43 11/10/2017 1153   CHOLHDL 4.0 11/10/2017 1153   LDLCALC 110 (H) 11/10/2017 1153    Physical Exam:    Physical Exam: Blood pressure 114/72, pulse 88, height 5\' 11"  (1.803 m), weight 268 lb (121.6 kg), SpO2 95 %.  GEN:  Well nourished, well developed in no acute distress HEENT: Normal NECK: No JVD; No carotid bruits LYMPHATICS: No lymphadenopathy CARDIAC: RR, no murmurs, rubs, gallops RESPIRATORY:  Clear to auscultation without rales, wheezing or rhonchi  ABDOMEN: Soft, non-tender, non-distended MUSCULOSKELETAL:  No edema; No deformity  SKIN: Warm and dry NEUROLOGIC:  Alert and oriented x 3    ASSESSMENT:    1. Acute on chronic combined systolic and diastolic  CHF (congestive heart failure) (HCC)    PLAN:    In order of problems listed above:  Acute on chronic combined CHF:    Jeffrey Lewis is feeling much better.  He is diuresed 7 pounds over the past 2 weeks.  He is tolerating the Entresto fairly well.  His heart rate is on the higher end of normal.  He is tolerated the medicines fairly well.  We will increase the carvedilol to 6.25 mg twice a day.  I will see him back in 6 weeks.  Anticipate going up on the Entresto at that visit if his blood pressure will allow.  Sometime shortly after that we will add Aldactone.    3.  Diabetes mellitus: His fasting glucose level at his primary care's office was around 250.  He needs to pay better attention to his diet.  He may be a good candidate for Jardiance.   Medication Adjustments/Labs and Tests Ordered: Current medicines are reviewed at length with the patient today.  Concerns regarding medicines are outlined above.  Orders Placed This Encounter  Procedures  . Basic metabolic panel   Meds ordered this encounter  Medications  . carvedilol (COREG) 6.25 MG tablet    Sig: Take 1 tablet (6.25 mg total) by mouth 2 (two) times daily.    Dispense:  180 tablet    Refill:  3    Dose change      Patient Instructions  Medication Instructions:  1) INCREASE Carvedilol to 6.25mg  twice daily  Labwork: BMET today  Testing/Procedures: None  Follow-Up: Your physician recommends that you schedule a follow-up appointment in: 6 weeks with Dr. Elease Hashimoto. (Can have 10/4 at 11:40A)   Any Other Special Instructions Will Be Listed Below (If Applicable).     If you need a refill on your cardiac medications before your next appointment, please call your pharmacy.      Signed, Kristeen Miss, MD  12/22/2017 11:22 AM    [facility]

## 2018-01-29 ENCOUNTER — Ambulatory Visit: Payer: BC Managed Care – PPO | Admitting: Cardiovascular Disease

## 2018-01-29 ENCOUNTER — Encounter: Payer: Self-pay | Admitting: Cardiovascular Disease

## 2018-01-29 VITALS — BP 122/82 | HR 74 | Ht 71.0 in | Wt 274.0 lb

## 2018-01-29 DIAGNOSIS — I5042 Chronic combined systolic (congestive) and diastolic (congestive) heart failure: Secondary | ICD-10-CM

## 2018-01-29 MED ORDER — SACUBITRIL-VALSARTAN 49-51 MG PO TABS: 1.0000 | ORAL_TABLET | Freq: Two times a day (BID) | ORAL | 11 refills | Status: DC

## 2018-01-29 NOTE — Patient Instructions (Signed)
Medication Instructions:  Your physician has recommended you make the following change in your medication:   INCREASE Entresto to 49-51 mg twice daily   If you need a refill on your cardiac medications before your next appointment, please call your pharmacy.   Lab work: Your physician recommends that you return for lab work in: 3 weeks for basic metabolic panel  If you have labs (blood work) drawn today and your tests are completely normal, you will receive your results only by: Marland Kitchen MyChart Message (if you have MyChart) OR . A paper copy in the mail If you have any lab test that is abnormal or we need to change your treatment, we will call you to review the results.  Testing/Procedures: None Ordered   Follow-Up: Your physician recommends that you schedule a follow-up appointment in: 3 months with Dr. Elease Hashimoto

## 2018-01-29 NOTE — Progress Notes (Signed)
Cardiology Office Note:    Date:  01/29/2018   ID:  [name], DOB 28-Dec-1960, MRN [id number]  PCP:  Harvest Forest, MD  Cardiologist:  Kristeen Miss, MD   Referring MD: Harvest Forest, MD   Chief Complaint  Patient presents with  . Congestive Heart Failure         Jeffrey Lewis is a 57 y.o. male with a hx of hypertension, obesity, hyperlipidemia.  He had episodes of shortness of breath that seem to be worsening.  He was scheduled for an echocardiogram.  The echo tech noticed that his ejection fraction was markedly reduced and he was worked into my schedule for further evaluation.  Several months of progressive dyspnea.     Worse several weeks ago  Developed a cough.  Saw [name] who ordered an echo  Has ocasional 2 pillow orthopnea.     Has PND  Significant DOE . 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  On meds for several years.  Still eats some salty foods.   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  He does not know if he has apneic episodes. Does not have early morning somnolence.  Is have some sleepiness associated with some narcotic cough syrup that he received recently.  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  He has been on torsemide.  We started [facility] during his last office visit.  Does not eat much salty foods.   Leg swelling has resolved.   Been found to have an elevated PSA level.  There is a chance that he will need a prostate biopsy.  Wt today = 266 Wt on JUly 16 was 273 lbs.   December 22, 2017:  Jeffrey Lewis seen today for follow-up of his acute on chronic combined systolic and diastolic congestive heart failure. His weight today is 268 pounds.  He is tolerating Entresto fairly well.  We have started him on low-dose carvedilol. Feeling better and better.   January 29, 2018:  Jeffrey Lewis is seen today for follow-up of his chronic combined congestive heart failure.  He is tolerating Entresto at the low dose fairly well. Weight today is 274 pounds which is up 8 pounds from his last visit in August. No swelling  Breathing is better.      Past Medical History:  Diagnosis Date  . Diabetes mellitus without complication (HCC)   . Elevated cholesterol   . Hypertension     Past Surgical History:  Procedure Laterality Date  . COLONOSCOPY WITH PROPOFOL N/A 01/07/2016   Procedure: COLONOSCOPY WITH PROPOFOL;  Surgeon: Charolett Bumpers, MD;  Location: [facility];  Service: Endoscopy;  Laterality: N/A;  . NO PAST SURGERIES      Current Medications: Current Meds  Medication Sig  . aspirin EC 81 MG tablet Take 81 mg by mouth every evening.  Marland Kitchen atorvastatin (LIPITOR) 40 MG tablet Take 40 mg by mouth every evening.  . carvedilol (COREG) 6.25  MG tablet Take 1 tablet (6.25 mg total) by mouth 2 (two) times daily.  . metFORMIN (GLUCOPHAGE) 500 MG tablet Take by mouth 2 (two) times daily with a meal.  . potassium chloride (K-DUR) 10 MEQ tablet Take 1 tablet (10 mEq total) by mouth daily.  Marland Kitchen torsemide (DEMADEX) 20 MG tablet Take 1 tablet (20 mg total) by mouth daily.  Marland Kitchen UNABLE TO FIND ploglitazome 30 mg daily in pm  . [DISCONTINUED] sacubitril-valsartan (ENTRESTO) 24-26 MG Take 1 tablet by mouth 2 (two) times daily.     Allergies:   Patient has no allergy information on record.   Social History   Socioeconomic History  . Marital status: Married    Spouse name: Not on file  . Number of children: Not on file  . Years of education: Not on file  . Highest education level: Not on file    Occupational History  . Not on file  Social Needs  . Financial resource strain: Not on file  . Food insecurity:    Worry: Not on file    Inability: Not on file  . Transportation needs:    Medical: Not on file    Non-medical: Not on file  Tobacco Use  . Smoking status: Never Smoker  . Smokeless tobacco: Never Used  Substance and Sexual Activity  . Alcohol use: Yes    Comment: 1 beer per day  . Drug use: No  . Sexual activity: Yes  Lifestyle  . Physical activity:    Days per week: Not on file    Minutes per session: Not on file  . Stress: Not on file  Relationships  . Social connections:    Talks on phone: Not on file    Gets together: Not on file    Attends religious service: Not on file    Active member of club or organization: Not on file    Attends meetings of clubs or organizations: Not on file    Relationship status: Not on file  Other Topics Concern  . Not on file  Social History Narrative  . Not on file     Family History: The patient's family history includes CAD in his brother; Hypertension in his father and mother.  ROS:   Please see the history of present illness.     All other systems reviewed and are negative.  EKGs/Labs/Other Studies Reviewed:    The following studies were reviewed today:    EKG:      Recent Labs: 11/10/2017: ALT 47; Hemoglobin 15.1; Platelets 218; TSH 1.950 12/22/2017: BUN 22; Creatinine, Ser 1.49; Potassium 4.0; Sodium 137  Recent Lipid Panel    Component Value Date/Time   CHOL 172 11/10/2017 1153   TRIG 95 11/10/2017 1153   HDL 43 11/10/2017 1153   CHOLHDL 4.0 11/10/2017 1153   LDLCALC 110 (H) 11/10/2017 1153    Physical Exam:    Physical Exam: Blood pressure 122/82, pulse 74, height 5\' 11"  (1.803 m), weight 274 lb (124.3 kg), SpO2 96 %.  GEN:   Middle age man,   NAD  HEENT: Normal NECK: No JVD; No carotid bruits LYMPHATICS: No lymphadenopathy CARDIAC: RRR   RESPIRATORY:  Clear to auscultation without rales,  wheezing or rhonchi  ABDOMEN: Soft, non-tender, non-distended MUSCULOSKELETAL:  No edema; No deformity  SKIN: Warm and dry NEUROLOGIC:  Alert and oriented x 3   ASSESSMENT:    1. Chronic combined systolic and diastolic heart failure (HCC)    PLAN:    In  order of problems listed above:  1.  Acute on chronic combined CHF:     He does not have any signs or symptoms of congestive heart failure at this time.  We will increase the Entresto to 49-51 mg a day.  I've  advised him to double his current tablets until he runs out and then start on his new prescription.  We will draw a basic metabolic profile in 3 weeks.  See him back in 3 months. He becomes lightheaded or if his blood pressure drops I have instructed him to hold his torsemide and his potassium.  2.  Obesity: The patient is gained 8 pounds since his last visit.  I do not detect any excess fluid.  He has been eating fairly large helpings of pasta and bread.  I have advised to hold off on eating any bread and to limit his pasta to once a month.    3.  Diabetes mellitus: His fasting glucose level at his primary care's office was around 250.  He needs to pay better attention to his diet.  He may be a good candidate for Jardiance.   Medication Adjustments/Labs and Tests Ordered: Current medicines are reviewed at length with the patient today.  Concerns regarding medicines are outlined above.  Orders Placed This Encounter  Procedures  . Basic Metabolic Panel (BMET)   Meds ordered this encounter  Medications  . sacubitril-valsartan (ENTRESTO) 49-51 MG    Sig: Take 1 tablet by mouth 2 (two) times daily.    Dispense:  60 tablet    Refill:  11      Patient Instructions  Medication Instructions:  Your physician has recommended you make the following change in your medication:   INCREASE Entresto to 49-51 mg twice daily   If you need a refill on your cardiac medications before your next appointment, please call your pharmacy.    Lab work: Your physician recommends that you return for lab work in: 3 weeks for basic metabolic panel  If you have labs (blood work) drawn today and your tests are completely normal, you will receive your results only by: Marland Kitchen MyChart Message (if you have MyChart) OR . A paper copy in the mail If you have any lab test that is abnormal or we need to change your treatment, we will call you to review the results.  Testing/Procedures: None Ordered   Follow-Up: Your physician recommends that you schedule a follow-up appointment in: 3 months with Dr. Elease Hashimoto      Signed, Kristeen Miss, MD  01/29/2018 2:20 PM    [facility]

## 2018-02-19 ENCOUNTER — Other Ambulatory Visit: Payer: BC Managed Care – PPO | Admitting: *Deleted

## 2018-02-19 DIAGNOSIS — I5042 Chronic combined systolic (congestive) and diastolic (congestive) heart failure: Secondary | ICD-10-CM

## 2018-02-19 LAB — BASIC METABOLIC PANEL
BUN / CREAT RATIO: 16 (ref 9–20)
BUN: 23 mg/dL (ref 6–24)
CHLORIDE: 99 mmol/L (ref 96–106)
CO2: 21 mmol/L (ref 20–29)
CREATININE: 1.44 mg/dL — AB (ref 0.76–1.27)
Calcium: 9.1 mg/dL (ref 8.7–10.2)
GFR calc Af Amer: 61 mL/min/{1.73_m2} (ref >59)
GFR calc non Af Amer: 53 mL/min/{1.73_m2} — ABNORMAL LOW (ref >59)
GLUCOSE: 173 mg/dL — AB (ref 65–99)
POTASSIUM: 4 mmol/L (ref 3.5–5.2)
SODIUM: 138 mmol/L (ref 134–144)

## 2018-03-13 DIAGNOSIS — E11319 Type 2 diabetes mellitus with unspecified diabetic retinopathy without macular edema: Secondary | ICD-10-CM | POA: Insufficient documentation

## 2018-03-31 DIAGNOSIS — N183 Chronic kidney disease, stage 3 unspecified: Secondary | ICD-10-CM | POA: Insufficient documentation

## 2018-05-03 ENCOUNTER — Encounter: Payer: Self-pay | Admitting: Cardiovascular Disease

## 2018-05-03 ENCOUNTER — Ambulatory Visit: Payer: BC Managed Care – PPO | Admitting: Cardiovascular Disease

## 2018-05-03 VITALS — BP 128/82 | HR 86 | Ht 71.0 in | Wt 273.0 lb

## 2018-05-03 DIAGNOSIS — I5022 Chronic systolic (congestive) heart failure: Secondary | ICD-10-CM | POA: Diagnosis not present

## 2018-05-03 MED ORDER — SPIRONOLACTONE 25 MG PO TABS: 25.0000 mg | ORAL_TABLET | Freq: Every day | ORAL | 11 refills | Status: DC

## 2018-05-03 NOTE — Patient Instructions (Addendum)
  Discuss starting Jardiance for your diabetes - its also very effective for treating heart failure   Medication Instructions:  Your physician has recommended you make the following change in your medication:  START Aldactone (Spironolactone) 25 mg once daily  If you need a refill on your cardiac medications before your next appointment, please call your pharmacy.    Lab work: Your physician recommends that you return for lab work on Thursday Jan. 23 - you do not have to fast    Testing/Procedures: Your physician has requested that you have an echocardiogram in 2 months a few days before your office visit with Dr. Elease Hashimoto. Echocardiography is a painless test that uses sound waves to create images of your heart. It provides your doctor with information about the size and shape of your heart and how well your heart's chambers and valves are working. This procedure takes approximately one hour. There are no restrictions for this procedure.     Follow-Up: Your physician recommends that you return for a follow-up appointment on Thursday January 23 at 11:00 for Nurse Visit/BP Check   At [facility], you and your health needs are our priority.  As part of our continuing mission to provide you with exceptional heart care, we have created designated Provider Care Teams.  These Care Teams include your primary Cardiologist (physician) and Advanced Practice Providers (APPs -  Physician Assistants and Nurse Practitioners) who all work together to provide you with the care you need, when you need it. You will need a follow up appointment in:  2 months.  You may see Kristeen Miss, MD or one of the following Advanced Practice Providers on your designated Care Team: Tereso Newcomer, PA-C [place], [place] . Berton Bon, NP

## 2018-05-03 NOTE — Progress Notes (Signed)
Cardiology Office Note:    Date:  05/03/2018   ID:  Jeffrey Lewis, DOB 1960-08-30, MRN [id number]  PCP:  Harvest Forest, MD  Cardiologist:  Kristeen Miss, MD   Referring MD: Harvest Forest, MD   Chief Complaint  Patient presents with  . Congestive Heart Failure         Jeffrey Lewis is a 58 y.o. male with a hx of hypertension, obesity, hyperlipidemia.  He had episodes of shortness of breath that seem to be worsening.  He was scheduled for an echocardiogram.  The echo tech noticed that his ejection fraction was markedly reduced and he was worked into my schedule for further evaluation.  Several months of progressive dyspnea.     Worse several weeks ago  Developed a cough.  Saw Jeffrey Lewis who ordered an echo  Has ocasional 2 pillow orthopnea.     Has PND  Significant DOE . 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  On meds for several years.  Still eats some salty foods.   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  He does not know if he has apneic episodes. Does not have early morning somnolence.  Is have some sleepiness associated with some narcotic cough syrup that he received recently.  Was just started on Furosemide   A day  - doesn't seem to be helping much   November 24, 2017: [name] was seen recently for new onset combined systolic and diastolic congestive heart failure.  He has been on torsemide.  We started [facility] during his last office visit.  Does not eat much salty foods.   Leg swelling has resolved.   Been found to have an elevated PSA level.  There is a chance that he will need a prostate biopsy.  Wt today = 266 Wt on JUly 16 was 273 lbs.   December 22, 2017:  [name] seen today for follow-up of his acute on chronic combined systolic and diastolic congestive heart failure. His weight today is 268 pounds.  He is tolerating Entresto fairly well.  We have started him on low-dose carvedilol. Feeling better and better.   January 29, 2018:  [name] is seen today for follow-up of his chronic combined congestive heart failure.  He is tolerating Entresto at the low dose fairly well. Weight today is 274 pounds which is up 8 pounds from his last visit in August. No swelling  Breathing is better.     May 03, 2018: [name] seen back today for a follow-up of his chronic systolic congestive heart failure. Today is 273 pounds which is 1 pound less than it was last visit. Is on Entresto 49-51 mg twice a day and seems to be tolerating it quite well. Has developed a rash behind his left calf.    Past Medical History:  Diagnosis Date  . Diabetes mellitus without complication (HCC)   . Elevated cholesterol   . Hypertension     Past Surgical History:  Procedure Laterality Date  . COLONOSCOPY WITH PROPOFOL N/A 01/07/2016   Procedure: COLONOSCOPY WITH PROPOFOL;  Surgeon: Charolett Bumpers, MD;  Location: [facility];  Service: Endoscopy;  Laterality: N/A;  . NO PAST SURGERIES      Current Medications: Current Meds  Medication Sig  . aspirin EC 81 MG tablet Take 81 mg by mouth every evening.  Marland Kitchen. atorvastatin (LIPITOR) 40 MG tablet Take 40 mg by mouth every evening.  . carvedilol (COREG) 6.25 MG tablet Take 1 tablet (6.25 mg total) by mouth 2 (two) times daily.  . metFORMIN (GLUCOPHAGE) 500 MG tablet Take 750 mg by mouth 2 (two) times daily with a meal.   . potassium chloride (K-DUR) 10 MEQ tablet Take 1 tablet (10 mEq total) by mouth daily.  . sacubitril-valsartan (ENTRESTO) 49-51 MG Take 1 tablet by mouth 2 (two) times daily.  Marland Kitchen. torsemide (DEMADEX) 20 MG tablet Take 1 tablet (20 mg total) by mouth daily.  Marland Kitchen. UNABLE TO FIND ploglitazome 30 mg daily in pm      Allergies:   Patient has no allergy information on record.   Social History   Socioeconomic History  . Marital status: Married    Spouse name: Not on file  . Number of children: Not on file  . Years of education: Not on file  . Highest education level: Not on file  Occupational History  . Not on file  Social Needs  . Financial resource strain: Not on file  . Food insecurity:    Worry: Not on file    Inability: Not on file  . Transportation needs:    Medical: Not on file    Non-medical: Not on file  Tobacco Use  . Smoking status: Never Smoker  . Smokeless tobacco: Never Used  Substance and Sexual Activity  . Alcohol use: Yes    Comment: 1 beer per day  . Drug use: No  . Sexual activity: Yes  Lifestyle  . Physical activity:    Days per week: Not on file    Minutes per session: Not on file  . Stress: Not on file  Relationships  . Social connections:    Talks on phone: Not on file    Gets together: Not on file    Attends religious service: Not on file    Active member of club or organization: Not on file    Attends meetings of clubs or organizations: Not on file    Relationship status: Not on file  Other Topics Concern  . Not on file  Social History Narrative  . Not on file     Family History: The patient's family history includes CAD in his brother; Hypertension in his father and mother.  ROS:   Please see the history of present illness.     All other systems reviewed and are negative.  EKGs/Labs/Other Studies Reviewed:    The following studies were reviewed today:    EKG:      Recent Labs: 11/10/2017: ALT 47; Hemoglobin 15.1; Platelets 218; TSH 1.950 02/19/2018: BUN 23; Creatinine, Ser 1.44; Potassium 4.0; Sodium 138  Recent Lipid Panel    Component Value Date/Time   CHOL 172 11/10/2017 1153   TRIG 95 11/10/2017 1153   HDL 43 11/10/2017 1153   CHOLHDL 4.0 11/10/2017 1153   LDLCALC 110 (H) 11/10/2017 1153    Physical Exam: Blood pressure  128/82, pulse 86, height 5\' 11"  (1.803 m), weight 273 lb (123.8 kg), SpO2 99 %.  GEN:  Moderate obesity, NAD  HEENT: Normal NECK: No JVD; No carotid bruits LYMPHATICS: No lymphadenopathy CARDIAC: RRR   RESPIRATORY:  Clear to auscultation without rales,  wheezing or rhonchi  ABDOMEN: Soft, non-tender, non-distended MUSCULOSKELETAL:  No edema; No deformity  SKIN: Warm and dry NEUROLOGIC:  Alert and oriented x 3    ASSESSMENT:    1. Chronic systolic heart failure (HCC)    PLAN:    In order of problems listed above:  1.  Acute on chronic combined CHF:     We will add Aldactone 25 mg a day.  We will have him return for nurse visit, blood pressure check, basic metabolic profile in 2 weeks. If he remains stable then will anticipate getting an echocardiogram in approximately 2 months.  I will see him in the office again in 2 months for follow-up visit.  Discussed the possibility that his blood pressure may drop with the addition of the Aldactone.  We may need to decrease the dose of the torsemide or perhaps stop the torsemide altogether. He will call us for any change of his symptoms or drastic changes of his weight or his blood pressure.   WE discussed starting Jardiance He will discuss with his primary MD   2.  Obesity:      3.  Diabetes mellitus:  He would greatly benefit from starting Jardiance. He will discuss with his primary MD      Medication Adjustments/Labs and Tests Ordered: Current medicines are reviewed at length with the patient today.  Concerns regarding medicines are outlined above.  Orders Placed This Encounter  Procedures  . Basic Metabolic Panel (BMET)   Meds ordered this encounter  Medications  . spironolactone (ALDACTONE) 25 MG tablet    Sig: Take 1 tablet (25 mg total) by mouth daily.    Dispense:  30 tablet    Refill:  11      Patient Instructions   Discuss starting Jardiance for your diabetes - its also very effective for treating heart  failure         Signed, Kristeen Miss, MD  05/03/2018 11:21 AM    [facility]

## 2018-05-20 ENCOUNTER — Ambulatory Visit: Payer: BC Managed Care – PPO

## 2018-05-20 ENCOUNTER — Other Ambulatory Visit: Payer: BC Managed Care – PPO

## 2018-05-25 ENCOUNTER — Encounter: Payer: Self-pay | Admitting: Cardiovascular Disease

## 2018-05-26 ENCOUNTER — Ambulatory Visit (INDEPENDENT_AMBULATORY_CARE_PROVIDER_SITE_OTHER): Payer: BC Managed Care – PPO

## 2018-05-26 ENCOUNTER — Other Ambulatory Visit: Payer: BC Managed Care – PPO | Admitting: *Deleted

## 2018-05-26 VITALS — BP 110/78 | Ht 71.0 in | Wt 272.0 lb

## 2018-05-26 DIAGNOSIS — I5022 Chronic systolic (congestive) heart failure: Secondary | ICD-10-CM

## 2018-05-26 LAB — BASIC METABOLIC PANEL
BUN/Creatinine Ratio: 17 (ref 9–20)
BUN: 28 mg/dL — ABNORMAL HIGH (ref 6–24)
CO2: 24 mmol/L (ref 20–29)
Calcium: 9.8 mg/dL (ref 8.7–10.2)
Chloride: 99 mmol/L (ref 96–106)
Creatinine, Ser: 1.69 mg/dL — ABNORMAL HIGH (ref 0.76–1.27)
GFR calc Af Amer: 50 mL/min/{1.73_m2} — ABNORMAL LOW (ref >59)
GFR calc non Af Amer: 43 mL/min/{1.73_m2} — ABNORMAL LOW (ref >59)
Glucose: 247 mg/dL — ABNORMAL HIGH (ref 65–99)
Potassium: 4.6 mmol/L (ref 3.5–5.2)
Sodium: 141 mmol/L (ref 134–144)

## 2018-05-26 NOTE — Progress Notes (Signed)
1.) Reason for visit: Blood Pressure Check   2.) Name of MD requesting visit: Dr. Elease Hashimoto  3.) H&P: Chronic Systolic Heart Failure  4.) ROS related to problem: SOB    5.) Assessment and plan per MD: The patient has been feeling fine since increase aldactone. Dr. Elease Hashimoto reviewed the blood pressure and said to continue his current therapy. The patient wanted to know if he should continue to see Urology and if so he will schedule an appointment.

## 2018-05-28 ENCOUNTER — Other Ambulatory Visit: Payer: Self-pay | Admitting: Nurse Practitioner

## 2018-07-15 ENCOUNTER — Other Ambulatory Visit: Payer: Self-pay

## 2018-07-15 ENCOUNTER — Ambulatory Visit (HOSPITAL_COMMUNITY): Payer: BC Managed Care – PPO | Attending: Cardiovascular Disease

## 2018-07-15 ENCOUNTER — Telehealth: Payer: Self-pay

## 2018-07-15 DIAGNOSIS — I5022 Chronic systolic (congestive) heart failure: Secondary | ICD-10-CM | POA: Insufficient documentation

## 2018-07-15 MED ORDER — PERFLUTREN LIPID MICROSPHERE
1.0000 mL | INTRAVENOUS | Status: AC | PRN
  Administered 2018-07-15: 2 mL via INTRAVENOUS

## 2018-07-15 NOTE — Telephone Encounter (Signed)
Left message for patient to call back regarding appointment with Dr. Elease Hashimoto on 3/25. Call placed due to restrictions enacted for Covid 19.

## 2018-07-19 NOTE — Telephone Encounter (Signed)
Patient is scheduled for virtual ov on Wed. 3/25

## 2018-07-19 NOTE — Telephone Encounter (Signed)
Left message for patient to call back to discuss virtual office visit 

## 2018-07-21 ENCOUNTER — Ambulatory Visit: Payer: BC Managed Care – PPO | Admitting: Cardiovascular Disease

## 2018-07-21 ENCOUNTER — Telehealth (INDEPENDENT_AMBULATORY_CARE_PROVIDER_SITE_OTHER): Payer: BC Managed Care – PPO | Admitting: Cardiovascular Disease

## 2018-07-21 ENCOUNTER — Other Ambulatory Visit: Payer: Self-pay

## 2018-07-21 VITALS — BP 130/72 | HR 98 | Ht 71.0 in | Wt 265.0 lb

## 2018-07-21 DIAGNOSIS — I110 Hypertensive heart disease with heart failure: Secondary | ICD-10-CM | POA: Diagnosis not present

## 2018-07-21 DIAGNOSIS — I10 Essential (primary) hypertension: Secondary | ICD-10-CM

## 2018-07-21 DIAGNOSIS — I5022 Chronic systolic (congestive) heart failure: Secondary | ICD-10-CM | POA: Diagnosis not present

## 2018-07-21 NOTE — Progress Notes (Signed)
Virtual Visit via Video Note    Evaluation Performed:  Follow-up visit  This visit type was conducted due to national recommendations for restrictions regarding the COVID-19 Pandemic (e.g. social distancing).  This format is felt to be most appropriate for this patient at this time.  All issues noted in this document were discussed and addressed.  No physical exam was performed (except for noted visual exam findings with Video Visits).  Please refer to the patient's chart (MyChart message for video visits and phone note for telephone visits) for the patient's consent to telehealth for Jeffrey Lewis.  Date:  07/21/2018   ID:  Jeffrey Lewis, DOB 09-07-1960, MRN [id number]  Patient Location:  [address] [place] [address]   Provider location:   [facility],  [place], [place]   PCP:  Jeffrey Forest, MD  Cardiologist:  Jeffrey Miss, MD  Electrophysiologist:  None   Chief Complaint:  Follow up CHF  July 21, 2018   Jeffrey Lewis is a 58 y.o. male who presents via audio/video conferencing for a telehealth visit today.   Jeffrey Lewis is a 58 year old gentleman with hypertension, obesity, hyperlipidemia I last saw him in May 03, 2018.  He is on middle dose Entresto.  We added Aldactone at his last office visit.  He is feeling very well.  He has not had any episodes of chest pain or shortness of breath   Breathing has improved.   No leg swelling .  No  Syncope or presyncope  no fever or chills .     His primary MD added Jardiance 10 mg BID  And has also added metformin 500 mg a day  Is walking some.   Works in the yard. Is now walking up the stairs at work  No leg edema, no orthopnea   The patient does not symptoms concerning for COVID-19 infection (fever, chills, cough, or new SHORTNESS OF BREATH).    Prior CV studies:   The following studies were reviewed today:    Past Medical History:  Diagnosis Date  . Diabetes mellitus without complication  (HCC)   . Elevated cholesterol   . Hypertension    Past Surgical History:  Procedure Laterality Date  . COLONOSCOPY WITH PROPOFOL N/A 01/07/2016   Procedure: COLONOSCOPY WITH PROPOFOL;  Surgeon: Jeffrey Bumpers, MD;  Location: [facility];  Service: Endoscopy;  Laterality: N/A;  . NO PAST SURGERIES       No outpatient medications have been marked as taking for the 07/21/18 encounter (Telemedicine) with Jeffrey Lewis, Deloris Ping, MD.     Allergies:   Patient has no allergy information on record.   Social History   Tobacco Use  . Smoking status: Never Smoker  . Smokeless tobacco: Never Used  Substance Use Topics  . Alcohol use: Yes    Comment: 1 beer per day  . Drug use: No     Family Hx: The patient's family history includes CAD in his brother; Hypertension in his father and mother.  ROS:   Please see the history of present illness.     All other systems reviewed and are negative.   Labs/Other Tests and Data Reviewed:    Recent Labs: 11/10/2017: ALT 47; Hemoglobin 15.1; Platelets 218; TSH 1.950 05/26/2018: BUN 28; Creatinine, Ser 1.69; Potassium 4.6; Sodium 141   Recent Lipid Panel Lab Results  Component Value Date/Time   CHOL 172 11/10/2017 11:53 AM   TRIG 95 11/10/2017 11:53 AM   HDL 43 11/10/2017  11:53 AM   CHOLHDL 4.0 11/10/2017 11:53 AM   LDLCALC 110 (H) 11/10/2017 11:53 AM    Wt Readings from Last 3 Encounters:  07/21/18 265 lb (120.2 kg)  05/26/18 272 lb (123.4 kg)  05/03/18 273 lb (123.8 kg)     Exam:    Vital Signs:  Ht 5\' 11"  (1.803 m)   Wt 265 lb (120.2 kg)   BMI 36.96 kg/m    Well nourished, well developed male in no acute distress. Breathing comfortably,   ASSESSMENT & PLAN:    1.  Chronic systolic CHF:   Doing well on the Entresto 49-51 mg,  Jardiance,  Aldactone 25 mg a day , coreg 6.25 BID, torsemide 20 mg a day ,  His original echocardiogram revealed an ejection fraction of 10 to 15%.  The most recent echocardiogram reveals an normal left  ventricular function.  His diastolic filling was normal.   At this point I think he is doing very well we will have him come back in in the next week or so to get a basic metabolic profile.  Continue with same medications.  2.  Diabetes mellitus: On metformin and Jardiance.  We will have him continue to follow-up with his primary medical doctor.  COVID-19 Education: The signs and symptoms of COVID-19 were discussed with the patient and how to seek care for testing (follow up with PCP or arrange E-visit).  The importance of social distancing was discussed today.  Patient Risk:   After full review of this patients clinical status, I feel that they are at least moderate risk at this time.  Time:   Today, I have spent 40  minutes with the patient with telehealth technology discussing  CHF, diabetes,  Med changes, diet,   Echo results .     Medication Adjustments/Labs and Tests Ordered: Current medicines are reviewed at length with the patient today.  Concerns regarding medicines are outlined above.  Tests Ordered: No orders of the defined types were placed in this encounter.  Medication Changes: No orders of the defined types were placed in this encounter.   Disposition:  in 3 month(s)  Signed, Jeffrey Miss, MD  07/21/2018 10:45 AM    [facility]

## 2018-07-21 NOTE — Patient Instructions (Signed)
Medication Instructions:  Your physician recommends that you continue on your current medications as directed. Please refer to the Current Medication list given to you today.  If you need a refill on your cardiac medications before your next appointment, please call your pharmacy.   Lab work: Your physician recommends that you return for lab work on Friday March 27  If you have labs (blood work) drawn today and your tests are completely normal, you will receive your results only by: Marland Kitchen MyChart Message (if you have MyChart) OR . A paper copy in the mail If you have any lab test that is abnormal or we need to change your treatment, we will call you to review the results.  Testing/Procedures: None Ordered  Follow-Up: Your physician recommends that you return for a follow-up appointment on Monday July 6 at 9:40 am

## 2018-07-23 ENCOUNTER — Other Ambulatory Visit: Payer: BC Managed Care – PPO | Admitting: *Deleted

## 2018-07-23 ENCOUNTER — Other Ambulatory Visit: Payer: Self-pay

## 2018-07-23 DIAGNOSIS — I10 Essential (primary) hypertension: Secondary | ICD-10-CM

## 2018-07-23 DIAGNOSIS — I5022 Chronic systolic (congestive) heart failure: Secondary | ICD-10-CM

## 2018-07-24 LAB — BASIC METABOLIC PANEL
BUN/Creatinine Ratio: 18 (ref 9–20)
BUN: 28 mg/dL — ABNORMAL HIGH (ref 6–24)
CHLORIDE: 103 mmol/L (ref 96–106)
CO2: 23 mmol/L (ref 20–29)
Calcium: 9.8 mg/dL (ref 8.7–10.2)
Creatinine, Ser: 1.56 mg/dL — ABNORMAL HIGH (ref 0.76–1.27)
GFR calc Af Amer: 55 mL/min/{1.73_m2} — ABNORMAL LOW (ref >59)
GFR calc non Af Amer: 48 mL/min/{1.73_m2} — ABNORMAL LOW (ref >59)
Glucose: 127 mg/dL — ABNORMAL HIGH (ref 65–99)
Potassium: 3.9 mmol/L (ref 3.5–5.2)
Sodium: 141 mmol/L (ref 134–144)

## 2018-08-30 ENCOUNTER — Telehealth: Payer: Self-pay

## 2018-08-30 NOTE — Telephone Encounter (Signed)
   [facility] Pre-operative Risk Assessment    Request for surgical clearance:  1. What type of surgery is being performed? Biopsy   2. When is this surgery scheduled? TBD   3. What type of clearance is required (medical clearance vs. Pharmacy clearance to hold med vs. Both)? Pharmacy  4. Are there any medications that need to be held prior to surgery and how long? Aspirin 5 days prior   5. Practice name and name of physician performing surgery?  [facility]   6. What is your office phone number [phone]    7.   What is your office fax number [phone]  8.   Anesthesia type (None, local, MAC, general) ?

## 2018-08-31 NOTE — Telephone Encounter (Signed)
   Primary Cardiologist: Kristeen Miss, MD  Chart reviewed as part of pre-operative protocol coverage. He has no known heart disease and may hold aspirin 5 days prior to his upcoming biopsy.   I will route this recommendation to the requesting party via Epic fax function and remove from pre-op pool.  Please call with questions.  Beatriz Stallion, PA-C 08/31/2018, 7:36 AM

## 2018-10-26 ENCOUNTER — Telehealth: Payer: Self-pay

## 2018-10-26 NOTE — Telephone Encounter (Signed)
Spoke with pt and he gave verbal consent for a video visit. Pt will have his vitals ready at the time of appt.    YOUR CARDIOLOGY TEAM HAS ARRANGED FOR AN E-VISIT FOR YOUR APPOINTMENT - PLEASE REVIEW IMPORTANT INFORMATION BELOW SEVERAL DAYS PRIOR TO YOUR APPOINTMENT  Due to the recent COVID-19 pandemic, we are transitioning in-person office visits to tele-medicine visits in an effort to decrease unnecessary exposure to our patients, their families, and staff. These visits are billed to your insurance just like a normal visit is. We also encourage you to sign up for MyChart if you have not already done so. You will need a smartphone if possible. For patients that do not have this, we can still complete the visit using a regular telephone but do prefer a smartphone to enable video when possible. You may have a family member that lives with you that can help. If possible, we also ask that you have a blood pressure cuff and scale at home to measure your blood pressure, heart rate and weight prior to your scheduled appointment. Patients with clinical needs that need an in-person evaluation and testing will still be able to come to the office if absolutely necessary. If you have any questions, feel free to call our office.     YOUR PROVIDER WILL BE USING THE FOLLOWING PLATFORM TO COMPLETE YOUR VISIT: [email or web address] . IF USING MYCHART - How to Download the MyChart App to Your SmartPhone   - If Apple, go to [employer] and type in MyChart in the search bar and download the app. If Android, ask patient to go to [employer] and type in [place] in the search bar and download the app. The app is free but as with any other app downloads, your phone may require you to verify saved payment information or Apple/Android password.  - You will need to then log into the app with your MyChart username and password, and select [facility] as your healthcare provider to link the account.  - When it is time for your visit, go  to the MyChart app, find appointments, and click Begin Video Visit. Be sure to Select Allow for your device to access the Microphone and Camera for your visit. You will then be connected, and your provider will be with you shortly.  **If you have any issues connecting or need assistance, please contact MyChart service desk [phone])**  **If using a computer, in order to ensure the best quality for your visit, you will need to use either of the following Internet Browsers: Insurance underwriter or [employer]**  . IF USING DOXIMITY or DOXY.ME - The staff will give you instructions on receiving your link to join the meeting the day of your visit.      2-3 DAYS BEFORE YOUR APPOINTMENT  You will receive a telephone call from one of our [place] team members - your caller ID may say "Unknown caller." If this is a video visit, we will walk you through how to get the video launched on your phone. We will remind you check your blood pressure, heart rate and weight prior to your scheduled appointment. If you have an Apple Watch or Kardia, please upload any pertinent ECG strips the day before or morning of your appointment to [place]. Our staff will also make sure you have reviewed the consent and agree to move forward with your scheduled tele-health visit.     THE DAY OF YOUR APPOINTMENT  Approximately 15 minutes  prior to your scheduled appointment, you will receive a telephone call from one of [facility] team - your caller ID may say "Unknown caller."  Our staff will confirm medications, vital signs for the day and any symptoms you may be experiencing. Please have this information available prior to the time of visit start. It may also be helpful for you to have a pad of paper and pen handy for any instructions given during your visit. They will also walk you through joining the smartphone meeting if this is a video visit.    CONSENT FOR TELE-HEALTH VISIT - PLEASE REVIEW  I hereby  voluntarily request, consent and authorize [facility] and its employed or contracted physicians, physician assistants, nurse practitioners or other licensed health care professionals (the Practitioner), to provide me with telemedicine health care services (the "Services") as deemed necessary by the treating Practitioner. I acknowledge and consent to receive the Services by the Practitioner via telemedicine. I understand that the telemedicine visit will involve communicating with the Practitioner through live audiovisual communication technology and the disclosure of certain medical information by electronic transmission. I acknowledge that I have been given the opportunity to request an in-person assessment or other available alternative prior to the telemedicine visit and am voluntarily participating in the telemedicine visit.  I understand that I have the right to withhold or withdraw my consent to the use of telemedicine in the course of my care at any time, without affecting my right to future care or treatment, and that the Practitioner or I may terminate the telemedicine visit at any time. I understand that I have the right to inspect all information obtained and/or recorded in the course of the telemedicine visit and may receive copies of available information for a reasonable fee.  I understand that some of the potential risks of receiving the Services via telemedicine include:  Marland Kitchen Delay or interruption in medical evaluation due to technological equipment failure or disruption; . Information transmitted may not be sufficient (e.g. poor resolution of images) to allow for appropriate medical decision making by the Practitioner; and/or  . In rare instances, security protocols could fail, causing a breach of personal health information.  Furthermore, I acknowledge that it is my responsibility to provide information about my medical history, conditions and care that is complete and accurate to the best  of my ability. I acknowledge that Practitioner's advice, recommendations, and/or decision may be based on factors not within their control, such as incomplete or inaccurate data provided by me or distortions of diagnostic images or specimens that may result from electronic transmissions. I understand that the practice of medicine is not an exact science and that Practitioner makes no warranties or guarantees regarding treatment outcomes. I acknowledge that I will receive a copy of this consent concurrently upon execution via email to the email address I last provided but may also request a printed copy by calling the office of [facility].    I understand that my insurance will be billed for this visit.   I have read or had this consent read to me. . I understand the contents of this consent, which adequately explains the benefits and risks of the Services being provided via telemedicine.  . I have been provided ample opportunity to ask questions regarding this consent and the Services and have had my questions answered to my satisfaction. . I give my informed consent for the services to be provided through the use of telemedicine in my medical care  By  participating in this telemedicine visit I agree to the above.

## 2018-11-01 ENCOUNTER — Other Ambulatory Visit: Payer: Self-pay

## 2018-11-01 ENCOUNTER — Telehealth (INDEPENDENT_AMBULATORY_CARE_PROVIDER_SITE_OTHER): Payer: BC Managed Care – PPO | Admitting: Cardiovascular Disease

## 2018-11-01 ENCOUNTER — Encounter: Payer: Self-pay | Admitting: Cardiovascular Disease

## 2018-11-01 VITALS — BP 121/95 | HR 89 | Ht 71.0 in | Wt 260.0 lb

## 2018-11-01 DIAGNOSIS — Z7189 Other specified counseling: Secondary | ICD-10-CM

## 2018-11-01 DIAGNOSIS — E782 Mixed hyperlipidemia: Secondary | ICD-10-CM

## 2018-11-01 DIAGNOSIS — I10 Essential (primary) hypertension: Secondary | ICD-10-CM

## 2018-11-01 DIAGNOSIS — I5022 Chronic systolic (congestive) heart failure: Secondary | ICD-10-CM | POA: Diagnosis not present

## 2018-11-01 NOTE — Patient Instructions (Signed)
Medication Instructions:  Your physician recommends that you continue on your current medications as directed. Please refer to the Current Medication list given to you today.  If you need a refill on your cardiac medications before your next appointment, please call your pharmacy.   Lab work: Your physician recommends that you return for lab work prior to your Coronary CT (We will call you to schedule) You will need to FAST for this appointment - nothing to eat or drink after midnight the night before except water.   Testing/Procedures: Please arrive at the [facility] main entrance of [facility] at xx:xx AM (30-45 minutes prior to test start time)  [facility] [place], [place] [address] [phone]  Proceed to the [facility] Radiology Department (First Floor).  Please follow these instructions carefully (unless otherwise directed):  Hold all erectile dysfunction medications at least 48 hours prior to test.  On the Night Before the Test: . Be sure to Drink plenty of water. . Do not consume any caffeinated/decaffeinated beverages or chocolate 12 hours prior to your test. . Do not take any antihistamines 12 hours prior to your test. . If you take Metformin do not take 24 hours prior to test.   On the Day of the Test: . Drink plenty of water. Do not drink any water within one hour of the test. . Do not eat any food 4 hours prior to the test. . You may take your regular medications prior to the test.  . Make certain you take your Carvedilol on the morning of the test  . HOLD Torsemide on the morning of the test.        After the Test: . Drink plenty of water. . After receiving IV contrast, you may experience a mild flushed feeling. This is normal. . On occasion, you may experience a mild rash up to 24 hours after the test. This is not dangerous. If this occurs, you can take Benadryl 25 mg and increase your fluid intake. . If you  experience trouble breathing, this can be serious. If it is severe call 911 IMMEDIATELY. If it is mild, please call our office. . DO NOT Take Metformin for 48 hours after completing test.    Follow-Up: At [facility], you and your health needs are our priority.  As part of our continuing mission to provide you with exceptional heart care, we have created designated Provider Care Teams.  These Care Teams include your primary Cardiologist (physician) and Advanced Practice Providers (APPs -  Physician Assistants and Nurse Practitioners) who all work together to provide you with the care you need, when you need it. You will need a follow up appointment in:  6 months.  Please call our office 2 months in advance to schedule this appointment.  You may see Mertie Moores, MD or one of the following Advanced Practice Providers on your designated Care Team: Richardson Dopp, PA-C [place], [place] . Daune Perch, NP

## 2018-11-01 NOTE — Progress Notes (Signed)
Virtual Visit via Video Note   This visit type was conducted due to national recommendations for restrictions regarding the COVID-19 Pandemic (e.g. social distancing) in an effort to limit this patient's exposure and mitigate transmission in our community.  Due to his co-morbid illnesses, this patient is at least at moderate risk for complications without adequate follow up.  This format is felt to be most appropriate for this patient at this time.  All issues noted in this document were discussed and addressed.  A limited physical exam was performed with this format.  Please refer to the patient's chart for his consent to telehealth for [facility]   Date:  11/01/2018   ID:  Jeffrey Lewis, DOB 04-24-1961, MRN [id number]  Patient Location: Home Provider Location: Home  PCP:  Audley Hose, MD  Cardiologist:  Mertie Moores, MD  Electrophysiologist:  None    July 21, 2018   Jeffrey Lewis is a 58 y.o. male who presents via audio/video conferencing for a telehealth visit today.   Jeffrey Lewis is a 58 year old gentleman with chronic systolic CHF,  hypertension, obesity, hyperlipidemia I last saw him in May 03, 2018.  He is on middle dose Entresto.  We added Aldactone at his last office visit.  He is feeling very well.  He has not had any episodes of chest pain or shortness of breath   Breathing has improved.   No leg swelling .  No  Syncope or presyncope  no fever or chills .     His primary MD added Jardiance 10 mg BID  And has also added metformin 500 mg a day  Is walking some.   Works in the yard. Is now walking up the stairs at work  No leg edema, no orthopnea     Evaluation Performed:  Follow-Up Visit  Chief Complaint:  CHF   November 01, 2018    Jeffrey Lewis is a 58 y.o. male with hx of chronic systolic CHF.   Echo in November 10, 2017 showed an EF of 10-15%.   He has been on Entresto, Aldactone, coreg and his EF has now normalized.   Breathing is  going well.  Working in the yard regularly .   No leg swelling .     The patient does not have symptoms concerning for COVID-19 infection (fever, chills, cough, or new shortness of breath).    Past Medical History:  Diagnosis Date  . Diabetes mellitus without complication ([place])   . Elevated cholesterol   . Hypertension    Past Surgical History:  Procedure Laterality Date  . COLONOSCOPY WITH PROPOFOL N/A 01/07/2016   Procedure: COLONOSCOPY WITH PROPOFOL;  Surgeon: Garlan Fair, MD;  Location: [facility];  Service: Endoscopy;  Laterality: N/A;  . NO PAST SURGERIES       No outpatient medications have been marked as taking for the 11/01/18 encounter (Appointment) with , Wonda Cheng, MD.     Allergies:   Patient has no allergy information on record.   Social History   Tobacco Use  . Smoking status: Never Smoker  . Smokeless tobacco: Never Used  Substance Use Topics  . Alcohol use: Yes    Comment: 1 beer per day  . Drug use: No     Family Hx: The patient's family history includes CAD in his brother; Hypertension in his father and mother.  ROS:   Please see the history of present illness.     All other systems reviewed  and are negative.   Prior CV studies:   The following studies were reviewed today:    Labs/Other Tests and Data Reviewed:    EKG:  No ECG reviewed.  Recent Labs: 11/10/2017: ALT 47; Hemoglobin 15.1; Platelets 218; TSH 1.950 07/23/2018: BUN 28; Creatinine, Ser 1.56; Potassium 3.9; Sodium 141   Recent Lipid Panel Lab Results  Component Value Date/Time   CHOL 172 11/10/2017 11:53 AM   TRIG 95 11/10/2017 11:53 AM   HDL 43 11/10/2017 11:53 AM   CHOLHDL 4.0 11/10/2017 11:53 AM   LDLCALC 110 (H) 11/10/2017 11:53 AM    Wt Readings from Last 3 Encounters:  07/21/18 265 lb (120.2 kg)  05/26/18 272 lb (123.4 kg)  05/03/18 273 lb (123.8 kg)     Objective:    Vital Signs:  There were no vitals taken for this visit.   VITAL SIGNS:  reviewed  GEN:  no acute distress EYES:  sclerae anicteric, EOMI - Extraocular Movements Intact RESPIRATORY:  normal respiratory effort, symmetric expansion CARDIOVASCULAR:  no peripheral edema SKIN:  no rash, lesions or ulcers. MUSCULOSKELETAL:  no obvious deformities. NEURO:  alert and oriented x 3, no obvious focal deficit PSYCH:  normal affect  ASSESSMENT & PLAN:    1. Chronic systolic CHF:   Jeffrey Lewis is doing very well.   EF has normalized.  BP is generally well controlled.   diatostolic BP is up slightly .  May have been before BP meds were taken this am  He has not had an ischemic work up.   Given his size, I do not think a myoview would be accurate.   Will get a Coronary CT angiogram .  Will see him in 6 months.   COVID-19 Education: The signs and symptoms of COVID-19 were discussed with the patient and how to seek care for testing (follow up with PCP or arrange E-visit).   The importance of social distancing was discussed today.  Time:   Today, I have spent  19  minutes with the patient with telehealth technology discussing the above problems.     Medication Adjustments/Labs and Tests Ordered: Current medicines are reviewed at length with the patient today.  Concerns regarding medicines are outlined above.   Tests Ordered: No orders of the defined types were placed in this encounter.   Medication Changes: No orders of the defined types were placed in this encounter.   Follow Up:  Virtual Visit or In Person in 6 month(s)  Signed, Kristeen Miss, MD  11/01/2018 8:06 AM    [facility]

## 2018-11-02 ENCOUNTER — Telehealth: Payer: Self-pay | Admitting: Nurse Practitioner

## 2018-11-02 NOTE — Telephone Encounter (Signed)
Left message for patient to call back to review instructions for Coronary CT scheduled for Friday 7/10. I advised that I will also need to schedule lab work to be done at a [facility] close to him.

## 2018-11-03 ENCOUNTER — Telehealth (HOSPITAL_COMMUNITY): Payer: Self-pay | Admitting: *Deleted

## 2018-11-03 NOTE — Telephone Encounter (Signed)
Called patient to offer assistance regarding upcoming cardiac imaging study; pt verbalizes understanding of appt date/time, parking situation and where to check in, pre-test NPO status and medications ordered, and verified current allergies; name and call back number provided for further questions should they arise  Confirmed pharmacy, medication will be called in

## 2018-11-03 NOTE — Telephone Encounter (Signed)
Left message for pt to call back to review instructions and arrange a plan to have labs drawn.

## 2018-11-03 NOTE — Telephone Encounter (Signed)
Called and LM for Metoprolol 100mg , 1 tablet pt top take 2 hours prior to exam on 11/05/2018, no refills

## 2018-11-04 ENCOUNTER — Other Ambulatory Visit: Payer: Self-pay

## 2018-11-04 ENCOUNTER — Telehealth: Payer: Self-pay | Admitting: Cardiovascular Disease

## 2018-11-04 DIAGNOSIS — E782 Mixed hyperlipidemia: Secondary | ICD-10-CM

## 2018-11-04 DIAGNOSIS — I5022 Chronic systolic (congestive) heart failure: Secondary | ICD-10-CM

## 2018-11-04 DIAGNOSIS — I10 Essential (primary) hypertension: Secondary | ICD-10-CM

## 2018-11-04 NOTE — Telephone Encounter (Signed)
New message   Pt c/o medication issue:  1. Name of Medication: metoprolol  2. How are you currently taking this medication (dosage and times per day)? n/a  3. Are you having a reaction (difficulty breathing--STAT)?n/a  4. What is your medication issue?Per Vicente Males at [facility] has questions about what type of metoprol needs to be [prescribed. Please call.

## 2018-11-04 NOTE — Telephone Encounter (Signed)
Pt needs the short acting Metoprolol .Adonis Housekeeper

## 2018-11-05 ENCOUNTER — Ambulatory Visit
Admission: RE | Admit: 2018-11-05 | Discharge: 2018-11-05 | Disposition: A | Discharge status: Home / Self Care | Payer: BC Managed Care – PPO | Source: Ambulatory Visit | Attending: Cardiovascular Disease | Admitting: Cardiovascular Disease

## 2018-11-05 ENCOUNTER — Other Ambulatory Visit: Payer: Self-pay

## 2018-11-05 DIAGNOSIS — I5022 Chronic systolic (congestive) heart failure: Secondary | ICD-10-CM | POA: Insufficient documentation

## 2018-11-05 DIAGNOSIS — I2510 Atherosclerotic heart disease of native coronary artery without angina pectoris: Secondary | ICD-10-CM

## 2018-11-05 LAB — BASIC METABOLIC PANEL
BUN/Creatinine Ratio: 19 (ref 9–20)
BUN: 37 mg/dL — ABNORMAL HIGH (ref 6–24)
CO2: 19 mmol/L — ABNORMAL LOW (ref 20–29)
Calcium: 9.8 mg/dL (ref 8.7–10.2)
Chloride: 97 mmol/L (ref 96–106)
Creatinine, Ser: 1.92 mg/dL — ABNORMAL HIGH (ref 0.76–1.27)
GFR calc Af Amer: 43 mL/min/{1.73_m2} — ABNORMAL LOW (ref >59)
GFR calc non Af Amer: 37 mL/min/{1.73_m2} — ABNORMAL LOW (ref >59)
Glucose: 120 mg/dL — ABNORMAL HIGH (ref 65–99)
Potassium: 4.5 mmol/L (ref 3.5–5.2)
Sodium: 136 mmol/L (ref 134–144)

## 2018-11-05 LAB — HEPATIC FUNCTION PANEL
ALT: 28 IU/L (ref 0–44)
AST: 25 IU/L (ref 0–40)
Albumin: 4.7 g/dL (ref 3.8–4.9)
Alkaline Phosphatase: 64 IU/L (ref 39–117)
Bilirubin Total: 0.4 mg/dL (ref 0.0–1.2)
Bilirubin, Direct: 0.15 mg/dL (ref 0.00–0.40)
Total Protein: 7.5 g/dL (ref 6.0–8.5)

## 2018-11-05 LAB — POCT I-STAT CREATININE: Creatinine, Ser: 1.7 mg/dL — ABNORMAL HIGH (ref 0.61–1.24)

## 2018-11-05 LAB — LIPID PANEL
Chol/HDL Ratio: 4.7 ratio (ref 0.0–5.0)
Cholesterol, Total: 182 mg/dL (ref 100–199)
HDL: 39 mg/dL — ABNORMAL LOW (ref >39)
LDL Calculated: 94 mg/dL (ref 0–99)
Triglycerides: 247 mg/dL — ABNORMAL HIGH (ref 0–149)
VLDL Cholesterol Cal: 49 mg/dL — ABNORMAL HIGH (ref 5–40)

## 2018-11-05 MED ORDER — IOHEXOL 350 MG/ML SOLN
100.0000 mL | Freq: Once | INTRAVENOUS | Status: AC | PRN
  Administered 2018-11-05: 100 mL via INTRAVENOUS

## 2018-11-05 MED ORDER — SODIUM CHLORIDE 0.9 % NICU IV INFUSION SIMPLE
500.0000 mL | INJECTION | Freq: Once | INTRAVENOUS | Status: AC
  Administered 2018-11-05: 10:00:00 500 mL via INTRAVENOUS

## 2018-11-05 MED ORDER — METOPROLOL TARTRATE 5 MG/5ML IV SOLN
10.0000 mg | Freq: Once | INTRAVENOUS | Status: AC
  Administered 2018-11-05: 10 mg via INTRAVENOUS

## 2018-11-05 MED ORDER — METOPROLOL TARTRATE 5 MG/5ML IV SOLN
5.0000 mg | Freq: Once | INTRAVENOUS | Status: AC
  Administered 2018-11-05: 5 mg via INTRAVENOUS

## 2018-11-05 MED ORDER — NITROGLYCERIN 0.4 MG SL SUBL
0.8000 mg | SUBLINGUAL_TABLET | SUBLINGUAL | Status: DC | PRN
  Administered 2018-11-05: 0.8 mg via SUBLINGUAL

## 2018-11-08 DIAGNOSIS — I5022 Chronic systolic (congestive) heart failure: Secondary | ICD-10-CM

## 2018-11-09 ENCOUNTER — Telehealth: Payer: Self-pay | Admitting: Nurse Practitioner

## 2018-11-09 DIAGNOSIS — I2584 Coronary atherosclerosis due to calcified coronary lesion: Secondary | ICD-10-CM

## 2018-11-09 DIAGNOSIS — I2510 Atherosclerotic heart disease of native coronary artery without angina pectoris: Secondary | ICD-10-CM

## 2018-11-09 DIAGNOSIS — E782 Mixed hyperlipidemia: Secondary | ICD-10-CM

## 2018-11-09 MED ORDER — ROSUVASTATIN CALCIUM 20 MG PO TABS: 20.0000 mg | ORAL_TABLET | Freq: Every day | ORAL | 3 refills | Status: DC

## 2018-11-09 NOTE — Telephone Encounter (Signed)
-----   Message from Thayer Headings, MD sent at 11/05/2018  6:58 PM EDT ----- Creatinine is elevated.  DC torsemide and potassium  ( have him keep in case he needs them again) Lets see how he does with the entresto and spironolactone .  He needs to call if he has worsening shortness of breath or signficant leg swelling .   He has moderate CAD.   Trigs and cholesterol levels are elevated.   DC atorva Start crestor 20 mg a day .    Check lipids, liver, bmp in 2 months

## 2018-11-09 NOTE — Telephone Encounter (Signed)
Results and plan of care reviewed with patient. He requests that results and plan also be sent through MyChart for his records. He verbalized understanding and agreement with plan. I advised him to call back prior to follow-up with questions or concerns. He thanked me for the call.

## 2018-11-11 NOTE — Telephone Encounter (Signed)
I spoke to the patient and reviewed medication changes.  He verbalized understanding and I mailed a reminder.

## 2018-11-11 NOTE — Telephone Encounter (Signed)
Follow up   Patient is calling to see what changes were made on his medication. Please call.

## 2018-11-12 ENCOUNTER — Telehealth: Payer: Self-pay

## 2018-11-12 NOTE — Telephone Encounter (Signed)
I have started an [place] PA through covermymeds: Key: [id number]

## 2018-11-15 NOTE — Telephone Encounter (Signed)
Letter received from [facility] stating that they have approvde the pts [place] PA. Approval good from 11/12/2018 until 11/12/2019.  I have notified the pts pharmacy of this approval.

## 2018-11-17 ENCOUNTER — Telehealth: Payer: Self-pay | Admitting: Cardiovascular Disease

## 2018-11-17 NOTE — Telephone Encounter (Signed)
New Message   Patient is calling to discuss his medication. He would not provide any additional information other than wanting to speak with Dr. Acie Fredrickson nurse about her meds. Please call to discuss.

## 2018-11-17 NOTE — Telephone Encounter (Signed)
Spoke with patient who called to ask if he was supposed to stop [place] when he started [place]. I told him that we did not advise him to stop Jardiance. I reviewed previous notes and appointments with Dr. Acie Fredrickson and advised him that the changes in medications we made in regards to [facility] included adding spironolactone and stopping torsemide and potassium. I advised that we recommend he continue Jardiance since it is cardio-protective but asked him to call Dr. Maia Petties to ensure that he take Jardiance and metformin. Patient verbalized understanding and agreement and thanked me for the call.

## 2018-11-29 ENCOUNTER — Other Ambulatory Visit: Payer: BC Managed Care – PPO | Admitting: *Deleted

## 2018-11-29 ENCOUNTER — Other Ambulatory Visit: Payer: Self-pay

## 2018-11-29 DIAGNOSIS — E782 Mixed hyperlipidemia: Secondary | ICD-10-CM

## 2018-11-29 DIAGNOSIS — I2584 Coronary atherosclerosis due to calcified coronary lesion: Secondary | ICD-10-CM

## 2018-11-29 DIAGNOSIS — I2510 Atherosclerotic heart disease of native coronary artery without angina pectoris: Secondary | ICD-10-CM

## 2018-11-29 LAB — HEPATIC FUNCTION PANEL
ALT: 42 IU/L (ref 0–44)
AST: 26 IU/L (ref 0–40)
Albumin: 4.5 g/dL (ref 3.8–4.9)
Alkaline Phosphatase: 58 IU/L (ref 39–117)
Bilirubin Total: 0.5 mg/dL (ref 0.0–1.2)
Bilirubin, Direct: 0.17 mg/dL (ref 0.00–0.40)
Total Protein: 6.8 g/dL (ref 6.0–8.5)

## 2018-11-29 LAB — LIPID PANEL
Chol/HDL Ratio: 2.7 ratio (ref 0.0–5.0)
Cholesterol, Total: 142 mg/dL (ref 100–199)
HDL: 53 mg/dL (ref >39)
LDL Calculated: 73 mg/dL (ref 0–99)
Triglycerides: 79 mg/dL (ref 0–149)
VLDL Cholesterol Cal: 16 mg/dL (ref 5–40)

## 2018-11-29 LAB — BASIC METABOLIC PANEL
BUN/Creatinine Ratio: 12 (ref 9–20)
BUN: 14 mg/dL (ref 6–24)
CO2: 18 mmol/L — ABNORMAL LOW (ref 20–29)
Calcium: 9.3 mg/dL (ref 8.7–10.2)
Chloride: 105 mmol/L (ref 96–106)
Creatinine, Ser: 1.21 mg/dL (ref 0.76–1.27)
GFR calc Af Amer: 75 mL/min/{1.73_m2} (ref >59)
GFR calc non Af Amer: 65 mL/min/{1.73_m2} (ref >59)
Glucose: 137 mg/dL — ABNORMAL HIGH (ref 65–99)
Potassium: 4.8 mmol/L (ref 3.5–5.2)
Sodium: 138 mmol/L (ref 134–144)

## 2018-11-30 ENCOUNTER — Encounter: Payer: Self-pay | Admitting: Cardiovascular Disease

## 2018-12-31 ENCOUNTER — Other Ambulatory Visit: Payer: Self-pay | Admitting: Cardiovascular Disease

## 2019-01-12 ENCOUNTER — Other Ambulatory Visit: Payer: BC Managed Care – PPO

## 2019-01-14 ENCOUNTER — Other Ambulatory Visit: Payer: Self-pay

## 2019-01-14 ENCOUNTER — Other Ambulatory Visit: Payer: BC Managed Care – PPO

## 2019-02-01 ENCOUNTER — Other Ambulatory Visit: Payer: Self-pay | Admitting: Cardiovascular Disease

## 2019-03-29 ENCOUNTER — Other Ambulatory Visit: Payer: Self-pay | Admitting: Cardiovascular Disease

## 2019-05-24 ENCOUNTER — Other Ambulatory Visit: Payer: Self-pay | Admitting: Cardiovascular Disease

## 2019-05-25 ENCOUNTER — Encounter: Payer: Self-pay | Admitting: Physician Assistant

## 2019-05-25 ENCOUNTER — Ambulatory Visit: Payer: BC Managed Care – PPO | Admitting: Physician Assistant

## 2019-05-25 DIAGNOSIS — E782 Mixed hyperlipidemia: Secondary | ICD-10-CM

## 2019-05-25 DIAGNOSIS — I10 Essential (primary) hypertension: Secondary | ICD-10-CM

## 2019-05-25 DIAGNOSIS — E119 Type 2 diabetes mellitus without complications: Secondary | ICD-10-CM | POA: Insufficient documentation

## 2019-05-25 DIAGNOSIS — I428 Other cardiomyopathies: Secondary | ICD-10-CM

## 2019-05-25 DIAGNOSIS — I5020 Unspecified systolic (congestive) heart failure: Secondary | ICD-10-CM

## 2019-05-25 DIAGNOSIS — I2510 Atherosclerotic heart disease of native coronary artery without angina pectoris: Secondary | ICD-10-CM

## 2019-05-25 HISTORY — DX: Atherosclerotic heart disease of native coronary artery without angina pectoris: I25.10

## 2019-05-25 HISTORY — DX: Other cardiomyopathies: I42.8

## 2019-05-25 HISTORY — DX: Unspecified systolic (congestive) heart failure: I50.20

## 2019-05-25 HISTORY — DX: Mixed hyperlipidemia: E78.2

## 2019-05-25 HISTORY — DX: Essential (primary) hypertension: I10

## 2019-05-25 NOTE — Progress Notes (Deleted)
Cardiology Office Note:    Date:  05/25/2019   ID:  Jeffrey Lewis, DOB [birth year], MRN [id number]  PCP:  Audley Hose, MD  Cardiologist:  Mertie Moores, MD *** Electrophysiologist:  None   Referring MD: Audley Hose, MD   No chief complaint on file. ***  History of Present Illness:    Jeffrey Lewis is a 59 y.o. male with:  Chronic systolic CHF Non-ischemic cardiomyopathy  Echo 7/19: EF 10-15 Echo 06/2018: EF 55-60 Coronary artery disease  Cor CTA 10/2018: mod non-obs CAD in pLAD, pLCx, LM and RCA; neg FFR >> Med Rx Hypertension  Hyperlipidemia  Diabetes mellitus  Obesity    [name] ***  The DICTATELATER SmartLink is not supported in this context. ***   Prior CV studies:   The following studies were reviewed today:  Coronary CTA 11/05/2018 Coronary Arteries:  Normal coronary origin.  Right dominance. RCA:  0-25%. Left main has mild ostial calcified plaque:  25-49%. LAD:  Proximal 50-69%. LCX: Proximal 25-49%; focal 50-69%.  OM1 and OM2 min plaque IMPRESSION: 1. Coronary calcium score of 297. This was 66 percentile for age and sex matched control. 2. Normal coronary origin with right dominance. 3. Moderate CAD in the proximal LAD and mid LCX arteries. CAD RADS 3. Aggressive medical management is recommended. Additional analysis with CT FFR will be submitted.  FFR: Normal FFR range is >0.80. 1. Left Main: 0.98. 2. LAD: Proximal: 0.94, distal: 0.89. 3. LCX: Proximal: 0.92, distal: 0.86. 4. OM1: 0.81. 5. OM2: 0.83. 6. RCA: Proximal: 0.94, distal:0.93. 7. PDA: 0.80. 8. PLA: 0.89.  IMPRESSION: 1. CT FFR analysis didn't show any significant stenosis. Aggressive medical management is indicated.   Echocardiogram 07/15/2018 EF 55-60, mild conc LVH, no RWMA, normal RVSF  Echocardiogram 11/10/17 EF 10-15, diff HK, mod BAE, reduced RVSF   Past Medical History:  Diagnosis Date  . CAD (coronary artery disease) 05/25/2019   Cor CTA 10/2018:   RCA 0-25; LM 25-49; pLLAD 50-69; pLCx 25-49 / Ca score 297 / FFR in LM, LCx, LAD, RCA normal >> Med Rx  . Diabetes mellitus   . Essential hypertension 05/25/2019  . HFrEF and return of normal LVF 05/25/2019   Echocardiogram 7/19: EF 10-15 // Echocardiogram 06/2018: EF 55-60 // Non-ischemic cardiomyopathy   . Mixed hyperlipidemia 05/25/2019  . Nonischemic cardiomyopathy 05/25/2019   Non-obs CAD by coronary CTA in 10/2018      Surgical Hx: The patient  has a past surgical history that includes No past surgeries and Colonoscopy with propofol (N/A, 01/07/2016).   Current Medications: No outpatient medications have been marked as taking for the 05/25/19 encounter (Appointment) with Richardson Dopp T, PA-C.     Allergies:   Patient has no known allergies.   Social History   Tobacco Use  . Smoking status: Never Smoker  . Smokeless tobacco: Never Used  Substance Use Topics  . Alcohol use: Yes    Comment: 1 beer per day  . Drug use: No     Family Hx: The patient's family history includes CAD in his brother; Hypertension in his father and mother.  ROS:   Please see the history of present illness.    ROS All other systems reviewed and are negative.   EKGs/Labs/Other Test Reviewed:    EKG:  EKG is *** ordered today.  The ekg ordered today demonstrates ***  Recent Labs: 11/29/2018: ALT 42; BUN 14; Creatinine, Ser 1.21; Potassium 4.8; Sodium 138   Recent Lipid Panel  Lab Results  Component Value Date/Time   CHOL 142 11/29/2018 11:05 AM   TRIG 79 11/29/2018 11:05 AM   HDL 53 11/29/2018 11:05 AM   CHOLHDL 2.7 11/29/2018 11:05 AM   LDLCALC 73 11/29/2018 11:05 AM    Physical Exam:    VS:  There were no vitals taken for this visit.    Wt Readings from Last 3 Encounters:  11/01/18 260 lb (117.9 kg)  07/21/18 265 lb (120.2 kg)  05/26/18 272 lb (123.4 kg)     ***Physical Exam  ASSESSMENT & PLAN:    ***  Dispo:  No follow-ups on file.   Medication Adjustments/Labs and Tests  Ordered: Current medicines are reviewed at length with the patient today.  Concerns regarding medicines are outlined above.  Tests Ordered: No orders of the defined types were placed in this encounter.  Medication Changes: No orders of the defined types were placed in this encounter.   Signed, Tereso Newcomer, PA-C  05/25/2019 7:56 AM    [facility] [address] [place], [place], [place]  [address] Phone: [phone]; Fax: [phone]

## 2019-05-26 ENCOUNTER — Encounter: Payer: Self-pay | Admitting: Nurse Practitioner

## 2019-05-26 ENCOUNTER — Other Ambulatory Visit: Payer: Self-pay

## 2019-05-26 ENCOUNTER — Encounter: Payer: Self-pay | Admitting: Cardiovascular Disease

## 2019-05-26 ENCOUNTER — Ambulatory Visit: Payer: BC Managed Care – PPO | Admitting: Cardiovascular Disease

## 2019-05-26 VITALS — BP 134/82 | HR 82 | Ht 71.0 in | Wt 271.0 lb

## 2019-05-26 DIAGNOSIS — I5022 Chronic systolic (congestive) heart failure: Secondary | ICD-10-CM

## 2019-05-26 DIAGNOSIS — E785 Hyperlipidemia, unspecified: Secondary | ICD-10-CM | POA: Diagnosis not present

## 2019-05-26 DIAGNOSIS — R9431 Abnormal electrocardiogram [ECG] [EKG]: Secondary | ICD-10-CM

## 2019-05-26 DIAGNOSIS — I5042 Chronic combined systolic (congestive) and diastolic (congestive) heart failure: Secondary | ICD-10-CM

## 2019-05-26 DIAGNOSIS — I2584 Coronary atherosclerosis due to calcified coronary lesion: Secondary | ICD-10-CM

## 2019-05-26 DIAGNOSIS — I10 Essential (primary) hypertension: Secondary | ICD-10-CM

## 2019-05-26 DIAGNOSIS — I2510 Atherosclerotic heart disease of native coronary artery without angina pectoris: Secondary | ICD-10-CM

## 2019-05-26 DIAGNOSIS — I428 Other cardiomyopathies: Secondary | ICD-10-CM

## 2019-05-26 NOTE — Patient Instructions (Signed)
Medication Instructions:  Your physician recommends that you continue on your current medications as directed. Please refer to the Current Medication list given to you today.  *If you need a refill on your cardiac medications before your next appointment, please call your pharmacy*  Lab Work: Your physician recommends that you return for lab work in: 6 months on the day of or a few days before your office visit with Dr. Elease Hashimoto.  You will need to FAST for this appointment - nothing to eat or drink after midnight the night before except water.  If you have labs (blood work) drawn today and your tests are completely normal, you will receive your results only by: Marland Kitchen MyChart Message (if you have MyChart) OR . A paper copy in the mail If you have any lab test that is abnormal or we need to change your treatment, we will call you to review the results.  Testing/Procedures: Your physician has requested that you have an echocardiogram. Echocardiography is a painless test that uses sound waves to create images of your heart. It provides your doctor with information about the size and shape of your heart and how well your heart's chambers and valves are working. This procedure takes approximately one hour. There are no restrictions for this procedure.  Your physician has requested that you have a lexiscan myoview. For further information please visit [email or web address] Please follow instruction sheet, as given.    Follow-Up: At [facility], you and your health needs are our priority.  As part of our continuing mission to provide you with exceptional heart care, we have created designated Provider Care Teams.  These Care Teams include your primary Cardiologist (physician) and Advanced Practice Providers (APPs -  Physician Assistants and Nurse Practitioners) who all work together to provide you with the care you need, when you need it.  Your next appointment:   6 month(s)  The format for your next  appointment:   In Person  Provider:   You may see Kristeen Miss, MD or one of the following Advanced Practice Providers on your designated Care Team:    Tereso Newcomer, PA-C  [place], [place]  Berton Bon, [place]

## 2019-05-26 NOTE — Progress Notes (Signed)
Cardiology Office Note:    Date:  05/26/2019   ID:  Jeffrey Lewis, DOB 09-22-1960, MRN [id number]  PCP:  Harvest Forest, MD  Cardiologist:  Kristeen Miss, MD   Referring MD: Harvest Forest, MD   Chief Complaint  Patient presents with  . Congestive Heart Failure         Jeffrey Lewis is a 59 y.o. male with a hx of hypertension, obesity, hyperlipidemia.  He had episodes of shortness of breath that seem to be worsening.  He was scheduled for an echocardiogram.  The echo tech noticed that his ejection fraction was markedly reduced and he was worked into my schedule for further evaluation.  Several months of progressive dyspnea.     Worse several weeks ago  Developed a cough.  Saw [name] who ordered an echo  Has ocasional 2 pillow orthopnea.     Has PND  Significant DOE . 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  On meds for several years.  Still eats some salty foods.   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  He does not know if he has apneic episodes. Does not have early morning somnolence.  Is have some sleepiness associated with some narcotic cough syrup that he received recently.  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  He has been on torsemide.  We started [facility] during his last office visit.  Does not eat much salty foods.   Leg swelling has resolved.   Been found to have an elevated PSA level.  There is a chance that he will need a prostate biopsy.  Wt today = 266 Wt on JUly 16 was 273 lbs.   December 22, 2017:  Jeffrey Lewis seen today for follow-up of his acute on chronic combined systolic and diastolic congestive heart failure. His weight today is 268 pounds.  He is tolerating Entresto fairly well.  We have started him on low-dose carvedilol. Feeling better and better.   January 29, 2018:  Jeffrey Lewis is seen today for follow-up of his chronic combined congestive heart failure.  He is tolerating Entresto at the low dose fairly well. Weight today is 274 pounds which is up 8 pounds from his last visit in August. No swelling  Breathing is better.     May 03, 2018: Jeffrey Lewis seen back today for a follow-up of his chronic systolic congestive heart failure. Today is 273 pounds which is 1 pound less than it was last visit. Is on Entresto 49-51 mg twice a day and seems to be tolerating it quite well. Has developed a rash behind his left calf.  Jan. 28, 2021  Doing well.  Breathing has improved. On entresto.   Now off the torsemide  Has some tingling in his arm.   intermittant No exertional cp or dyspnea.    Coronary CT angiogram performed November 05, 2018 reveals no significant coronary artery disease. FFR analysis also confirmed the absence of significant  coronary artery disease.  Past Medical History:  Diagnosis Date  . CAD (coronary artery disease) 05/25/2019   Cor CTA 10/2018:  RCA 0-25; LM 25-49; pLLAD 50-69; pLCx 25-49 / Ca score 297 / FFR in LM, LCx, LAD, RCA normal >> Med Rx  . Diabetes mellitus   . Essential hypertension 05/25/2019  . HFrEF and return of normal LVF 05/25/2019   Echocardiogram 7/19: EF 10-15 // Echocardiogram 06/2018: EF 55-60 // Non-ischemic cardiomyopathy   . Mixed hyperlipidemia 05/25/2019  . Nonischemic cardiomyopathy 05/25/2019   Non-obs CAD by coronary CTA in 10/2018     Past Surgical History:  Procedure Laterality Date  . COLONOSCOPY WITH PROPOFOL N/A 01/07/2016   Procedure: COLONOSCOPY WITH PROPOFOL;  Surgeon: Garlan Fair, MD;  Location: [facility] END[facility]COPY;  Service:  Endoscopy;  Laterality: N/A;  . NO PAST SURGERIES      Current Medications: Current Meds  Medication Sig  . aspirin EC 81 MG tablet Take 81 mg by mouth every evening.  . carvedilol (COREG) 6.25 MG tablet TAKE 1 TABLET BY MOUTH TWICE A DAY  . empagliflozin (JARDIANCE) 10 MG TABS tablet Take 10 mg by mouth daily.  Marland Kitchen ENTRESTO 49-51 MG TAKE 1 TABLET BY MOUTH TWICE A DAY  . glimepiride (AMARYL) 2 MG tablet Take 1 tablet by mouth daily.  . metFORMIN (GLUCOPHAGE-XR) 500 MG 24 hr tablet Take 1,000 mg by mouth daily.  . Multiple Vitamins-Minerals (CENTRUM SILVER 50+MEN PO) Take 1 tablet by mouth daily.  . rosuvastatin (CRESTOR) 20 MG tablet Take 1 tablet (20 mg total) by mouth daily.  Marland Kitchen spironolactone (ALDACTONE) 25 MG tablet TAKE 1 TABLET BY MOUTH EVERY DAY     Allergies:   Patient has no known allergies.   Social History   Socioeconomic History  . Marital status: Married    Spouse name: Not on file  . Number of children: Not on file  . Years of education: Not on file  . Highest education level: Not on file  Occupational History  . Not on file  Tobacco Use  . Smoking status: Never Smoker  . Smokeless tobacco: Never Used  Substance and Sexual Activity  . Alcohol use: Yes    Comment: 1 beer per day  . Drug use: No  . Sexual activity: Yes  Other Topics Concern  . Not on file  Social History Narrative  . Not on file   Social Determinants of Health   Financial Resource Strain:   . Difficulty of Paying Living Expenses: Not on file  Food Insecurity:   . Worried About Charity fundraiser in the Last Year: Not on file  . Ran Out of Food in the Last Year: Not on file  Transportation Needs:   . Lack of Transportation (Medical): Not on file  . Lack of Transportation (Non-Medical): Not on file  Physical Activity:   . Days of Exercise per Week: Not on file  . Minutes of Exercise per Session: Not on file  Stress:   . Feeling of Stress : Not on file  Social Connections:   .  Frequency of Communication with Friends and Family: Not on file  . Frequency of Social Gatherings with Friends and Family: Not on file  . Attends Religious Services: Not on file  . Active Member of Clubs or Organizations: Not on file  . Attends Archivist Meetings: Not on file  . Marital Status: Not on file     Family History: The patient's family history includes  CAD in his brother; Hypertension in his father and mother.  ROS:   Please see the history of present illness.     All other systems reviewed and are negative.  EKGs/Labs/Other Studies Reviewed:    The following studies were reviewed today:    Recent Labs: 11/29/2018: ALT 42; BUN 14; Creatinine, Ser 1.21; Potassium 4.8; Sodium 138  Recent Lipid Panel    Component Value Date/Time   CHOL 142 11/29/2018 1105   TRIG 79 11/29/2018 1105   HDL 53 11/29/2018 1105   CHOLHDL 2.7 11/29/2018 1105   LDLCALC 73 11/29/2018 1105    Physical Exam: Blood pressure 134/82, pulse 82, height 5\' 11"  (1.803 m), weight 271 lb (122.9 kg), peak flow 96 L/min.  GEN:  Well nourished, well developed in no acute distress, moderately obese.  HEENT: Normal NECK: No JVD; No carotid bruits LYMPHATICS: No lymphadenopathy CARDIAC: RRR , no murmurs, rubs, gallops RESPIRATORY:  Clear to auscultation without rales, wheezing or rhonchi  ABDOMEN: Soft, non-tender, non-distended MUSCULOSKELETAL:  No edema; No deformity  SKIN: Warm and dry NEUROLOGIC:  Alert and oriented x 3   EKG:    May 26, 2019: Normal sinus rhythm at 82.  He has inferior Q waves that are new from his previous EKG.  He has no acute ST or T wave changes.   ASSESSMENT:    1. Coronary artery calcification   2. Chronic systolic CHF (congestive heart failure) (HCC)   3. Chronic combined systolic and diastolic heart failure (HCC)   4. Abnormal ECG   5. Hyperlipidemia LDL goal <70    PLAN:    In order of problems listed above:  1.  Acute on chronic combined CHF:      Doing well on current therapy.  Cont meds.  Will get an echo for further assessment of his LV function   2.  New inf Q waves.   Had only mild - mod CAD by coronary CT angio.   Will get a 2 day lexiscan myoview .     He has not had any signs or symptoms of heart failure and denies any chest pain.  He has occasional left arm tingling this has been present for months and is not associated with exercise.  This sounds more like a neuromuscular issue.   2.  Obesity:   Encouraged weight loss    3.  Diabetes mellitus:   Further plans per primary MD     Medication Adjustments/Labs and Tests Ordered: Current medicines are reviewed at length with the patient today.  Concerns regarding medicines are outlined above.  Orders Placed This Encounter  Procedures  . Lipid Profile  . Basic Metabolic Panel (BMET)  . Hepatic function panel  . MYOCARDIAL PERFUSION IMAGING  . EKG 12-Lead  . ECHOCARDIOGRAM COMPLETE   No orders of the defined types were placed in this encounter.     Patient Instructions  Medication Instructions:  Your physician recommends that you continue on your current medications as directed. Please refer to the Current Medication list given to you today.  *If you need a refill on your cardiac medications before your next appointment, please call your pharmacy*  Lab Work: Your physician recommends that you return for lab work in: 6 months on the day of or a few days before your office visit with Dr. May 28, 2019.  You will need to FAST for this appointment - nothing to eat or drink after midnight the night before except water.  If you have labs (  blood work) drawn today and your tests are completely normal, you will receive your results only by: Marland Kitchen MyChart Message (if you have MyChart) OR . A paper copy in the mail If you have any lab test that is abnormal or we need to change your treatment, we will call you to review the results.  Testing/Procedures: Your physician has requested that  you have an echocardiogram. Echocardiography is a painless test that uses sound waves to create images of your heart. It provides your doctor with information about the size and shape of your heart and how well your heart's chambers and valves are working. This procedure takes approximately one hour. There are no restrictions for this procedure.  Your physician has requested that you have a lexiscan myoview. For further information please visit [email or web address] Please follow instruction sheet, as given.    Follow-Up: At [facility], you and your health needs are our priority.  As part of our continuing mission to provide you with exceptional heart care, we have created designated Provider Care Teams.  These Care Teams include your primary Cardiologist (physician) and Advanced Practice Providers (APPs -  Physician Assistants and Nurse Practitioners) who all work together to provide you with the care you need, when you need it.  Your next appointment:   6 month(s)  The format for your next appointment:   In Person  Provider:   You may see Kristeen Miss, MD or one of the following Advanced Practice Providers on your designated Care Team:    Tereso Newcomer, PA-C  [place]in [place], [place]  [place], [place]       Signed, Kristeen Miss, MD  05/26/2019 12:37 PM    [facility]

## 2019-05-30 ENCOUNTER — Telehealth: Payer: Self-pay

## 2019-05-30 NOTE — Telephone Encounter (Signed)
Referral notes sent from [facility] [phone] ,  Notes sent to scheduling

## 2019-06-06 ENCOUNTER — Telehealth (HOSPITAL_COMMUNITY): Payer: Self-pay | Admitting: *Deleted

## 2019-06-06 NOTE — Telephone Encounter (Signed)
Left message on voicemail per DPR in reference to upcoming appointment scheduled on 06/10/19 at 10:15 with detailed instructions given per Myocardial Perfusion Study Information Sheet for the test. LM to arrive 15 minutes early, and that it is imperative to arrive on time for appointment to keep from having the test rescheduled. If you need to cancel or reschedule your appointment, please call the office within 24 hours of your appointment. Failure to do so may result in a cancellation of your appointment, and a $50 no show fee. Phone number given for call back for any questions.

## 2019-06-10 ENCOUNTER — Encounter (HOSPITAL_COMMUNITY): Payer: BC Managed Care – PPO

## 2019-06-10 ENCOUNTER — Other Ambulatory Visit (HOSPITAL_COMMUNITY): Payer: BC Managed Care – PPO

## 2019-06-21 ENCOUNTER — Telehealth (HOSPITAL_COMMUNITY): Payer: Self-pay | Admitting: *Deleted

## 2019-06-21 NOTE — Telephone Encounter (Signed)
Patient given detailed instructions per Myocardial Perfusion Study Information Sheet for the test on 11/21/19 at 7:15. Patient notified to arrive 15 minutes early and that it is imperative to arrive on time for appointment to keep from having the test rescheduled.  If you need to cancel or reschedule your appointment, please call the office within 24 hours of your appointment. . Patient verbalized understanding.Jeffrey Lewis

## 2019-06-24 ENCOUNTER — Ambulatory Visit (HOSPITAL_BASED_OUTPATIENT_CLINIC_OR_DEPARTMENT_OTHER): Payer: BC Managed Care – PPO

## 2019-06-24 ENCOUNTER — Ambulatory Visit (HOSPITAL_COMMUNITY): Payer: BC Managed Care – PPO | Attending: Cardiology

## 2019-06-24 ENCOUNTER — Other Ambulatory Visit: Payer: Self-pay

## 2019-06-24 DIAGNOSIS — I5042 Chronic combined systolic (congestive) and diastolic (congestive) heart failure: Secondary | ICD-10-CM

## 2019-06-24 DIAGNOSIS — R9431 Abnormal electrocardiogram [ECG] [EKG]: Secondary | ICD-10-CM | POA: Diagnosis not present

## 2019-06-24 DIAGNOSIS — I2510 Atherosclerotic heart disease of native coronary artery without angina pectoris: Secondary | ICD-10-CM | POA: Insufficient documentation

## 2019-06-24 DIAGNOSIS — E785 Hyperlipidemia, unspecified: Secondary | ICD-10-CM

## 2019-06-24 DIAGNOSIS — I2584 Coronary atherosclerosis due to calcified coronary lesion: Secondary | ICD-10-CM | POA: Insufficient documentation

## 2019-06-24 LAB — MYOCARDIAL PERFUSION IMAGING
LV dias vol: 168 mL (ref 62–150)
LV sys vol: 90 mL
Peak HR: 93 {beats}/min
Rest HR: 71 {beats}/min
SDS: 0
SRS: 0
SSS: 0
TID: 1.13

## 2019-06-24 LAB — ECHOCARDIOGRAM COMPLETE
Height: 71 in
Weight: 4336 oz

## 2019-06-24 MED ORDER — TECHNETIUM TC 99M TETROFOSMIN IV KIT
10.7000 | PACK | Freq: Once | INTRAVENOUS | Status: AC | PRN
  Administered 2019-06-24: 10.7 via INTRAVENOUS
  Filled 2019-06-24: qty 11

## 2019-06-24 MED ORDER — REGADENOSON 0.4 MG/5ML IV SOLN
0.4000 mg | Freq: Once | INTRAVENOUS | Status: AC
  Administered 2019-06-24: 0.4 mg via INTRAVENOUS

## 2019-06-24 MED ORDER — PERFLUTREN LIPID MICROSPHERE
1.0000 mL | INTRAVENOUS | Status: AC | PRN
  Administered 2019-06-24: 1 mL via INTRAVENOUS
  Administered 2019-06-24: 2 mL via INTRAVENOUS

## 2019-06-24 MED ORDER — TECHNETIUM TC 99M TETROFOSMIN IV KIT
31.8000 | PACK | Freq: Once | INTRAVENOUS | Status: AC | PRN
  Administered 2019-06-24: 31.8 via INTRAVENOUS
  Filled 2019-06-24: qty 32

## 2019-06-30 ENCOUNTER — Telehealth: Payer: Self-pay | Admitting: Nurse Practitioner

## 2019-06-30 MED ORDER — ENTRESTO 49-51 MG PO TABS: 1.0000 | ORAL_TABLET | Freq: Two times a day (BID) | ORAL | 11 refills | Status: DC

## 2019-06-30 NOTE — Telephone Encounter (Signed)
-----   Message from Vesta Mixer, MD sent at 06/27/2019 11:02 AM EST ----- Echo is unchanged from previous.  Cont current meds

## 2019-06-30 NOTE — Telephone Encounter (Signed)
Reviewed echo and stress test results with patient. He asks if he should continue Entresto and I confirmed. He seems to think he went without the [facility] for some time because we would not send the refill. I advised that this is a medication we want him to continue unless we tell him otherwise, so if there are problems with his refill in the future, to please call the office and ask for Dr. Harvie Bridge nurse. He states he does now have the medication and I advised that I will verify that patient has enough medication to last until his follow-up planned for July. Patient verbalized understanding and agreement with plan and thanked me for the call.

## 2019-07-08 ENCOUNTER — Ambulatory Visit: Payer: BC Managed Care – PPO | Attending: Internal Medicine

## 2019-07-08 DIAGNOSIS — Z23 Encounter for immunization: Secondary | ICD-10-CM

## 2019-07-08 NOTE — Progress Notes (Signed)
   Covid-19 Vaccination Clinic  Name:  Jeffrey Lewis    MRN: [id number] DOB: 03/27/1961  07/08/2019  Jeffrey Lewis was observed post Covid-19 immunization for 15 minutes without incident. He was provided with Vaccine Information Sheet and instruction to access the V-Safe system.   Jeffrey Lewis was instructed to call 911 with any severe reactions post vaccine: Marland Kitchen Difficulty breathing  . Swelling of face and throat  . A fast heartbeat  . A bad rash all over body  . Dizziness and weakness   Immunizations Administered    Name Date Dose VIS Date Route   Pfizer COVID-19 Vaccine 07/08/2019  9:26 AM 0.3 mL 04/08/2019 Intramuscular   Manufacturer: [employer], [employer]   Lot: [id number]   NDC: [address]-0

## 2019-08-02 ENCOUNTER — Ambulatory Visit: Payer: BC Managed Care – PPO | Attending: Internal Medicine

## 2019-08-02 DIAGNOSIS — Z23 Encounter for immunization: Secondary | ICD-10-CM

## 2019-08-02 NOTE — Progress Notes (Signed)
   Covid-19 Vaccination Clinic  Name:  Jeffrey Lewis    MRN: [id number] DOB: November 16, 1960  08/02/2019  [name] was observed post Covid-19 immunization for 15 minutes without incident. He was provided with Vaccine Information Sheet and instruction to access the V-Safe system.   [name] was instructed to call 911 with any severe reactions post vaccine: Marland Kitchen Difficulty breathing  . Swelling of face and throat  . A fast heartbeat  . A bad rash all over body  . Dizziness and weakness   Immunizations Administered    Name Date Dose VIS Date Route   Pfizer COVID-19 Vaccine 08/02/2019  9:30 AM 0.3 mL 04/08/2019 Intramuscular   Manufacturer: [employer], [employer]   Lot: [id number]   NDC: [address]-1

## 2019-11-07 ENCOUNTER — Other Ambulatory Visit: Payer: BC Managed Care – PPO | Admitting: *Deleted

## 2019-11-07 ENCOUNTER — Other Ambulatory Visit: Payer: Self-pay

## 2019-11-07 DIAGNOSIS — R9431 Abnormal electrocardiogram [ECG] [EKG]: Secondary | ICD-10-CM

## 2019-11-07 DIAGNOSIS — E785 Hyperlipidemia, unspecified: Secondary | ICD-10-CM

## 2019-11-07 DIAGNOSIS — I5042 Chronic combined systolic (congestive) and diastolic (congestive) heart failure: Secondary | ICD-10-CM

## 2019-11-07 DIAGNOSIS — I2584 Coronary atherosclerosis due to calcified coronary lesion: Secondary | ICD-10-CM

## 2019-11-07 LAB — BASIC METABOLIC PANEL
BUN/Creatinine Ratio: 15 (ref 10–24)
BUN: 18 mg/dL (ref 8–27)
CO2: 19 mmol/L — ABNORMAL LOW (ref 20–29)
Calcium: 9.6 mg/dL (ref 8.6–10.2)
Chloride: 107 mmol/L — ABNORMAL HIGH (ref 96–106)
Creatinine, Ser: 1.19 mg/dL (ref 0.76–1.27)
GFR calc Af Amer: 76 mL/min/{1.73_m2} (ref >59)
GFR calc non Af Amer: 66 mL/min/{1.73_m2} (ref >59)
Glucose: 125 mg/dL — ABNORMAL HIGH (ref 65–99)
Potassium: 4.9 mmol/L (ref 3.5–5.2)
Sodium: 139 mmol/L (ref 134–144)

## 2019-11-07 LAB — HEPATIC FUNCTION PANEL
ALT: 31 IU/L (ref 0–44)
AST: 20 IU/L (ref 0–40)
Albumin: 4.6 g/dL (ref 3.8–4.9)
Alkaline Phosphatase: 52 IU/L (ref 48–121)
Bilirubin Total: 0.5 mg/dL (ref 0.0–1.2)
Bilirubin, Direct: 0.15 mg/dL (ref 0.00–0.40)
Total Protein: 7.4 g/dL (ref 6.0–8.5)

## 2019-11-07 LAB — LIPID PANEL
Chol/HDL Ratio: 3.3 ratio (ref 0.0–5.0)
Cholesterol, Total: 181 mg/dL (ref 100–199)
HDL: 55 mg/dL (ref >39)
LDL Chol Calc (NIH): 108 mg/dL — ABNORMAL HIGH (ref 0–99)
Triglycerides: 99 mg/dL (ref 0–149)
VLDL Cholesterol Cal: 18 mg/dL (ref 5–40)

## 2019-11-18 ENCOUNTER — Telehealth: Payer: Self-pay

## 2019-11-18 NOTE — Telephone Encounter (Signed)
**Note De-Identified Jeffrey Lewis Obfuscation** I started a Entresto PA through covermymeds.  Key: [id number]

## 2019-11-21 NOTE — Telephone Encounter (Signed)
**Note De-Identified Jeffrey Lewis Obfuscation** Letter received from [facility] stating that they have approved the pts [place] PA. Approval is valid from 11/18/2019 until 11/17/2020.  I have notified [facility] of this approval.

## 2019-11-23 ENCOUNTER — Other Ambulatory Visit: Payer: Self-pay | Admitting: Cardiovascular Disease

## 2019-12-17 ENCOUNTER — Other Ambulatory Visit: Payer: Self-pay | Admitting: Cardiovascular Disease

## 2020-04-04 ENCOUNTER — Other Ambulatory Visit: Payer: Self-pay | Admitting: Cardiovascular Disease

## 2020-04-17 ENCOUNTER — Other Ambulatory Visit: Payer: Self-pay | Admitting: Cardiovascular Disease

## 2020-05-15 ENCOUNTER — Other Ambulatory Visit: Payer: Self-pay | Admitting: Cardiovascular Disease

## 2020-07-09 ENCOUNTER — Other Ambulatory Visit: Payer: Self-pay | Admitting: Cardiovascular Disease

## 2020-07-22 ENCOUNTER — Other Ambulatory Visit: Payer: Self-pay | Admitting: Cardiovascular Disease

## 2020-08-17 ENCOUNTER — Other Ambulatory Visit: Payer: Self-pay

## 2020-08-17 ENCOUNTER — Encounter: Payer: Self-pay | Admitting: Emergency Medicine

## 2020-08-17 ENCOUNTER — Emergency Department: Payer: BC Managed Care – PPO

## 2020-08-17 ENCOUNTER — Inpatient Hospital Stay (HOSPITAL_COMMUNITY)
Admit: 2020-08-17 | Discharge: 2020-08-17 | Disposition: A | Discharge status: Home / Self Care | Payer: BC Managed Care – PPO | Attending: Medical | Admitting: Medical

## 2020-08-17 ENCOUNTER — Observation Stay
Admission: EM | Admit: 2020-08-17 | Discharge: 2020-08-18 | Disposition: A | Discharge status: Home / Self Care | Payer: BC Managed Care – PPO | Attending: Internal Medicine | Admitting: Internal Medicine

## 2020-08-17 DIAGNOSIS — E782 Mixed hyperlipidemia: Secondary | ICD-10-CM | POA: Diagnosis present

## 2020-08-17 DIAGNOSIS — R0602 Shortness of breath: Secondary | ICD-10-CM | POA: Diagnosis present

## 2020-08-17 DIAGNOSIS — I2510 Atherosclerotic heart disease of native coronary artery without angina pectoris: Secondary | ICD-10-CM | POA: Diagnosis not present

## 2020-08-17 DIAGNOSIS — I5020 Unspecified systolic (congestive) heart failure: Secondary | ICD-10-CM | POA: Diagnosis present

## 2020-08-17 DIAGNOSIS — Z79899 Other long term (current) drug therapy: Secondary | ICD-10-CM | POA: Diagnosis not present

## 2020-08-17 DIAGNOSIS — E669 Obesity, unspecified: Secondary | ICD-10-CM | POA: Diagnosis present

## 2020-08-17 DIAGNOSIS — E119 Type 2 diabetes mellitus without complications: Secondary | ICD-10-CM | POA: Diagnosis not present

## 2020-08-17 DIAGNOSIS — Z7984 Long term (current) use of oral hypoglycemic drugs: Secondary | ICD-10-CM | POA: Insufficient documentation

## 2020-08-17 DIAGNOSIS — E118 Type 2 diabetes mellitus with unspecified complications: Secondary | ICD-10-CM | POA: Diagnosis not present

## 2020-08-17 DIAGNOSIS — Z20822 Contact with and (suspected) exposure to covid-19: Secondary | ICD-10-CM | POA: Diagnosis not present

## 2020-08-17 DIAGNOSIS — E11319 Type 2 diabetes mellitus with unspecified diabetic retinopathy without macular edema: Secondary | ICD-10-CM | POA: Diagnosis present

## 2020-08-17 DIAGNOSIS — Z7982 Long term (current) use of aspirin: Secondary | ICD-10-CM

## 2020-08-17 DIAGNOSIS — I509 Heart failure, unspecified: Secondary | ICD-10-CM | POA: Diagnosis not present

## 2020-08-17 DIAGNOSIS — I4891 Unspecified atrial fibrillation: Secondary | ICD-10-CM | POA: Diagnosis not present

## 2020-08-17 DIAGNOSIS — I10 Essential (primary) hypertension: Secondary | ICD-10-CM | POA: Diagnosis present

## 2020-08-17 DIAGNOSIS — I428 Other cardiomyopathies: Secondary | ICD-10-CM | POA: Insufficient documentation

## 2020-08-17 DIAGNOSIS — I110 Hypertensive heart disease with heart failure: Secondary | ICD-10-CM | POA: Insufficient documentation

## 2020-08-17 DIAGNOSIS — Z6835 Body mass index (BMI) 35.0-35.9, adult: Secondary | ICD-10-CM

## 2020-08-17 DIAGNOSIS — I77810 Thoracic aortic ectasia: Secondary | ICD-10-CM | POA: Diagnosis present

## 2020-08-17 DIAGNOSIS — I4819 Other persistent atrial fibrillation: Principal | ICD-10-CM | POA: Diagnosis present

## 2020-08-17 DIAGNOSIS — E1139 Type 2 diabetes mellitus with other diabetic ophthalmic complication: Secondary | ICD-10-CM

## 2020-08-17 DIAGNOSIS — I5022 Chronic systolic (congestive) heart failure: Secondary | ICD-10-CM | POA: Diagnosis present

## 2020-08-17 LAB — ECHOCARDIOGRAM COMPLETE
Height: 71 in
S' Lateral: 2.6 cm
Weight: 4081.6 oz

## 2020-08-17 LAB — BASIC METABOLIC PANEL
Anion gap: 8 (ref 5–15)
BUN: 19 mg/dL (ref 8–23)
CO2: 24 mmol/L (ref 22–32)
Calcium: 9.8 mg/dL (ref 8.9–10.3)
Chloride: 107 mmol/L (ref 98–111)
Creatinine, Ser: 1.13 mg/dL (ref 0.61–1.24)
GFR, Estimated: >60 mL/min (ref >60)
Glucose, Bld: 139 mg/dL — ABNORMAL HIGH (ref 70–99)
Potassium: 3.8 mmol/L (ref 3.5–5.1)
Sodium: 139 mmol/L (ref 135–145)

## 2020-08-17 LAB — GLUCOSE, CAPILLARY
Glucose-Capillary: 140 mg/dL — ABNORMAL HIGH (ref 70–99)
Glucose-Capillary: 191 mg/dL — ABNORMAL HIGH (ref 70–99)
Glucose-Capillary: 204 mg/dL — ABNORMAL HIGH (ref 70–99)

## 2020-08-17 LAB — RESP PANEL BY RT-PCR (FLU A&B, COVID) ARPGX2
Influenza A by PCR: NEGATIVE
Influenza B by PCR: NEGATIVE
SARS Coronavirus 2 by RT PCR: NEGATIVE

## 2020-08-17 LAB — CBC
HCT: 48.6 % (ref 39.0–52.0)
Hemoglobin: 15.8 g/dL (ref 13.0–17.0)
MCH: 29.5 pg (ref 26.0–34.0)
MCHC: 32.5 g/dL (ref 30.0–36.0)
MCV: 90.7 fL (ref 80.0–100.0)
Platelets: 203 10*3/uL (ref 150–400)
RBC: 5.36 MIL/uL (ref 4.22–5.81)
RDW: 13.8 % (ref 11.5–15.5)
WBC: 7.1 10*3/uL (ref 4.0–10.5)
nRBC: 0 % (ref 0.0–0.2)

## 2020-08-17 LAB — HEMOGLOBIN A1C
Hgb A1c MFr Bld: 6.4 % — ABNORMAL HIGH (ref 4.8–5.6)
Mean Plasma Glucose: 136.98 mg/dL

## 2020-08-17 LAB — CBG MONITORING, ED: Glucose-Capillary: 155 mg/dL — ABNORMAL HIGH (ref 70–99)

## 2020-08-17 LAB — BRAIN NATRIURETIC PEPTIDE: B Natriuretic Peptide: 27.3 pg/mL (ref 0.0–100.0)

## 2020-08-17 LAB — TSH: TSH: 4.261 u[IU]/mL (ref 0.350–4.500)

## 2020-08-17 LAB — HIV ANTIBODY (ROUTINE TESTING W REFLEX): HIV Screen 4th Generation wRfx: NONREACTIVE

## 2020-08-17 LAB — TROPONIN I (HIGH SENSITIVITY)
Troponin I (High Sensitivity): 9 ng/L (ref <18)
Troponin I (High Sensitivity): 13 ng/L (ref <18)

## 2020-08-17 MED ORDER — METOPROLOL TARTRATE 25 MG PO TABS
25.0000 mg | ORAL_TABLET | Freq: Once | ORAL | Status: AC
  Administered 2020-08-17: 25 mg via ORAL
  Filled 2020-08-17: qty 1

## 2020-08-17 MED ORDER — DILTIAZEM HCL-DEXTROSE 125-5 MG/125ML-% IV SOLN (PREMIX)
5.0000 mg/h | INTRAVENOUS | Status: DC
  Administered 2020-08-17: 10 mg/h via INTRAVENOUS

## 2020-08-17 MED ORDER — ENOXAPARIN SODIUM 60 MG/0.6ML ~~LOC~~ SOLN
0.5000 mg/kg | SUBCUTANEOUS | Status: DC
  Administered 2020-08-17 – 2020-08-18 (×2): 57.5 mg via SUBCUTANEOUS
  Filled 2020-08-17 (×2): qty 0.6

## 2020-08-17 MED ORDER — METOPROLOL TARTRATE 25 MG PO TABS
25.0000 mg | ORAL_TABLET | Freq: Four times a day (QID) | ORAL | Status: DC
  Administered 2020-08-17: 25 mg via ORAL
  Filled 2020-08-17: qty 1

## 2020-08-17 MED ORDER — ENOXAPARIN SODIUM 40 MG/0.4ML ~~LOC~~ SOLN
40.0000 mg | Freq: Once | SUBCUTANEOUS | Status: AC
  Administered 2020-08-17: 40 mg via SUBCUTANEOUS
  Filled 2020-08-17: qty 0.4

## 2020-08-17 MED ORDER — ACETAMINOPHEN 325 MG PO TABS: 650.0000 mg | ORAL_TABLET | ORAL | Status: DC | PRN

## 2020-08-17 MED ORDER — DILTIAZEM HCL-DEXTROSE 125-5 MG/125ML-% IV SOLN (PREMIX)
5.0000 mg/h | INTRAVENOUS | Status: DC
  Administered 2020-08-17: 5 mg/h via INTRAVENOUS
  Filled 2020-08-17: qty 125

## 2020-08-17 MED ORDER — METOPROLOL TARTRATE 50 MG PO TABS
50.0000 mg | ORAL_TABLET | Freq: Two times a day (BID) | ORAL | Status: DC
  Administered 2020-08-17: 50 mg via ORAL
  Filled 2020-08-17: qty 1

## 2020-08-17 MED ORDER — INSULIN ASPART 100 UNIT/ML ~~LOC~~ SOLN
0.0000 [IU] | Freq: Three times a day (TID) | SUBCUTANEOUS | Status: DC
  Administered 2020-08-17: 4 [IU] via SUBCUTANEOUS
  Filled 2020-08-17 (×2): qty 1

## 2020-08-17 MED ORDER — INSULIN ASPART 100 UNIT/ML ~~LOC~~ SOLN
0.0000 [IU] | Freq: Every day | SUBCUTANEOUS | Status: DC
  Filled 2020-08-17: qty 1

## 2020-08-17 MED ORDER — ONDANSETRON HCL 4 MG/2ML IJ SOLN: 4.0000 mg | Freq: Four times a day (QID) | INTRAMUSCULAR | Status: DC | PRN

## 2020-08-17 NOTE — ED Notes (Signed)
Cardizem drip on hold for low bp 77/65 - pt denies dizziness, cp, sob or any other acute changes.  Will continue to monitor bp and restart Cardizem at slower rate when bp stabilizes

## 2020-08-17 NOTE — Progress Notes (Signed)
PROGRESS NOTE    SEATON HOFMANN  OZY:[id number] DOB: 05-May-1960 DOA: 08/17/2020 PCP: Harvest Forest, MD   Assessment & Plan:   Principal Problem:   Atrial fibrillation with rapid ventricular response, new onset (HCC) Active Problems:   Heart failure with reduced ejection fraction and return of normal LVF   NICM (nonischemic cardiomyopathy) (HCC)   CAD (coronary artery disease)   Essential hypertension   DM (diabetes mellitus), type 2 with ophthalmic complications (HCC)  A. Fib: w/ RVR. New onset. Continue on IV diltiazem drip. Continue on home dose of coreg. Continue on tele. Echo ordered. Cardio consulted  Nonischemic cardiomyopathy: cannot further specify. Hx of EF 10-15% in 2019 and EF of 55% in Feb 2021. Continue on home dose of coreg, entresto, spironolactone. Cardio consulted  HTN: continue on coreg, entresto, spironolactone   DM2: likely poorly controlled. Continue on SSI w/ accuchecks.   Obesity: BMI 35.5. Complicates overall care and prognosis   DVT prophylaxis: lovenox  Code Status: full  Family Communication:  Disposition Plan: likely d/c back home   Level of care: Progressive Cardiac   Status is: Inpatient  Remains inpatient appropriate because:Ongoing diagnostic testing needed not appropriate for outpatient work up, IV treatments appropriate due to intensity of illness or inability to take PO and Inpatient level of care appropriate due to severity of illness   Dispo: The patient is from: Home              Anticipated d/c is to: Home              Patient currently is not medically stable to d/c.   Difficult to place patient : unclear      Consultants:   Cardio    Procedures:    Antimicrobials:    Subjective: Pt c/o fatigue   Objective: Vitals:   08/17/20 0605 08/17/20 0610 08/17/20 0715 08/17/20 0730  BP: 114/61 (!) 94/50 (!) 117/92 132/79  Pulse: (!) 101 89 63 67  Resp: 18 20 14 16   Temp:      TempSrc:      SpO2: 94% 94% 95% 94%   Weight:      Height:       No intake or output data in the 24 hours ending 08/17/20 0823 Filed Weights   08/17/20 0010  Weight: 115.7 kg    Examination:  General exam: Appears calm and comfortable  Respiratory system: Clear to auscultation. Respiratory effort normal. Cardiovascular system: irregularly irregular. No rubs, gallops or clicks. Gastrointestinal system: Abdomen is nondistended, soft and nontender.  Normal bowel sounds heard. Central nervous system: Alert and oriented. Moves all extremities  Psychiatry: Judgement and insight appear normal. Flat mood and affect    Data Reviewed: I have personally reviewed following labs and imaging studies  CBC: Recent Labs  Lab 08/17/20 0011  WBC 7.1  HGB 15.8  HCT 48.6  MCV 90.7  PLT 203   Basic Metabolic Panel: Recent Labs  Lab 08/17/20 0011  NA 139  K 3.8  CL 107  CO2 24  GLUCOSE 139*  BUN 19  CREATININE 1.13  CALCIUM 9.8   GFR: Estimated Creatinine Clearance: 88.8 mL/min (by C-G formula based on SCr of 1.13 mg/dL). Liver Function Tests: No results for input(s): AST, ALT, ALKPHOS, BILITOT, PROT, ALBUMIN in the last 168 hours. No results for input(s): LIPASE, AMYLASE in the last 168 hours. No results for input(s): AMMONIA in the last 168 hours. Coagulation Profile: No results for input(s): INR, PROTIME in  the last 168 hours. Cardiac Enzymes: No results for input(s): CKTOTAL, CKMB, CKMBINDEX, TROPONINI in the last 168 hours. BNP (last 3 results) No results for input(s): PROBNP in the last 8760 hours. HbA1C: No results for input(s): HGBA1C in the last 72 hours. CBG: Recent Labs  Lab 08/17/20 0740  GLUCAP 155*   Lipid Profile: No results for input(s): CHOL, HDL, LDLCALC, TRIG, CHOLHDL, LDLDIRECT in the last 72 hours. Thyroid Function Tests: No results for input(s): TSH, T4TOTAL, FREET4, T3FREE, THYROIDAB in the last 72 hours. Anemia Panel: No results for input(s): VITAMINB12, FOLATE, FERRITIN, TIBC,  IRON, RETICCTPCT in the last 72 hours. Sepsis Labs: No results for input(s): PROCALCITON, LATICACIDVEN in the last 168 hours.  Recent Results (from the past 240 hour(s))  Resp Panel by RT-PCR (Flu A&B, Covid) Nasopharyngeal Swab     Status: None   Collection Time: 08/17/20  2:45 AM   Specimen: Nasopharyngeal Swab; Nasopharyngeal(NP) swabs in vial transport medium  Result Value Ref Range Status   SARS Coronavirus 2 by RT PCR NEGATIVE NEGATIVE Final    Comment: (NOTE) SARS-CoV-2 target nucleic acids are NOT DETECTED.  The SARS-CoV-2 RNA is generally detectable in upper respiratory specimens during the acute phase of infection. The lowest concentration of SARS-CoV-2 viral copies this assay can detect is 138 copies/mL. A negative result does not preclude SARS-Cov-2 infection and should not be used as the sole basis for treatment or other patient management decisions. A negative result may occur with  improper specimen collection/handling, submission of specimen other than nasopharyngeal swab, presence of viral mutation(s) within the areas targeted by this assay, and inadequate number of viral copies(<138 copies/mL). A negative result must be combined with clinical observations, patient history, and epidemiological information. The expected result is Negative.  Fact Sheet for Patients:  BloggerCourse.com  Fact Sheet for Healthcare Providers:  SeriousBroker.it  This test is no t yet approved or cleared by the [place] FDA and  has been authorized for detection and/or diagnosis of SARS-CoV-2 by FDA under an Emergency Use Authorization (EUA). This EUA will remain  in effect (meaning this test can be used) for the duration of the COVID-19 declaration under Section 564(b)(1) of the Act, 21 U.S.C.section 360bbb-3(b)(1), unless the authorization is terminated  or revoked sooner.       Influenza A by PCR NEGATIVE NEGATIVE Final    Influenza B by PCR NEGATIVE NEGATIVE Final    Comment: (NOTE) The Xpert Xpress SARS-CoV-2/FLU/RSV plus assay is intended as an aid in the diagnosis of influenza from Nasopharyngeal swab specimens and should not be used as a sole basis for treatment. Nasal washings and aspirates are unacceptable for Xpert Xpress SARS-CoV-2/FLU/RSV testing.  Fact Sheet for Patients: BloggerCourse.com  Fact Sheet for Healthcare Providers: SeriousBroker.it  This test is not yet approved or cleared by the [place] FDA and has been authorized for detection and/or diagnosis of SARS-CoV-2 by FDA under an Emergency Use Authorization (EUA). This EUA will remain in effect (meaning this test can be used) for the duration of the COVID-19 declaration under Section 564(b)(1) of the Act, 21 U.S.C. section 360bbb-3(b)(1), unless the authorization is terminated or revoked.  Performed at [facility], [address] [place], [place] [address]          Radiology Studies: DG Chest 2 View  Result Date: 08/17/2020 CLINICAL DATA:  Shortness of breath EXAM: CHEST - 2 VIEW COMPARISON:  11/02/2017 FINDINGS: Cardiac shadow is enlarged but stable. The lungs are clear. Degenerative changes of  the thoracic spine are noted. IMPRESSION: No active cardiopulmonary disease. Electronically Signed   By: Alcide Clever M.D.   On: 08/17/2020 02:21        Scheduled Meds: . enoxaparin (LOVENOX) injection  0.5 mg/kg Subcutaneous Q24H  . insulin aspart  0-20 Units Subcutaneous TID WC  . insulin aspart  0-5 Units Subcutaneous QHS   Continuous Infusions: . diltiazem (CARDIZEM) infusion Stopped (08/17/20 0329)  . diltiazem (CARDIZEM) infusion 2.5 mg/hr (08/17/20 0631)     LOS: 0 days    Time spent: 35 mins     Charise Killian, MD [facility] Pager [phone]  If 7PM-7AM, please contact night-coverage 08/17/2020, 8:23 AM

## 2020-08-17 NOTE — ED Notes (Signed)
CBG 155 

## 2020-08-17 NOTE — ED Notes (Signed)
Pt now returned from xray dept via stretcher- no acute changes

## 2020-08-17 NOTE — Consult Note (Addendum)
Cardiology Consultation:   Patient ID: Jeffrey Lewis MRN: [id number]; DOB: 09/01/1960  Admit date: 08/17/2020 Date of Consult: 08/17/2020  PCP:  Harvest Forest, MD   [facility]  Cardiologist:  Kristeen Miss, MD  Advanced Practice Provider:  No care team member to display Electrophysiologist:  None    Patient Profile:   Jeffrey Lewis is a 60 y.o. male with a hx of HTN, obesity, HLD who is being seen today for the evaluation of new onset afib at the request of Dr. Para March.  History of Present Illness:   Mr. Jeffrey Lewis is followed by Dr. Elease Lewis for the above cardiac issues. Patient has a history of HFrEF on torsemide with EF as low as 10-15% in 10/2017. Echo in 2020 showed LVEF 55-60%. HE had a cardiac CT showing no significant CAD. Most recent echo 06/24/19 showed LVEF 55-60%, borderline hypokinesis of the inf wall, mod LVH, normal RV function.   He was seen on 05/26/19 and had new q waves inf and lexiscan was ordered. He was asymptomatic. This was abnormal with LVEF 45-54%, low risk, no ischemia, global hypokinesis.   The patient presented to the ER 08/17/20 for SOB and palpitations. Symptoms started 2 hours prior to his arrival. HE noticed symptoms when he laid down to sleep. Also had some SOB on exertion. No chest pain, LLE, fever, chills, lightheadedness, dizziness.   In the ER BP 164/90, rate 111, RR 17, normal O2. CXR showed no acute abnormalities. EKG showed Afib RVR, 118bpm. Labs showed potassium 3.8, creatinine 1.13, BUN 19, WBC 7.1, Hgb 15.8. Respiratory panel negative. HS trop 9>13. BNP 27. He was started on diltiazem infusion and admitted for further work-up.   Past Medical History:  Diagnosis Date  . CAD (coronary artery disease) 05/25/2019   Cor CTA 10/2018:  RCA 0-25; LM 25-49; pLLAD 50-69; pLCx 25-49 / Ca score 297 / FFR in LM, LCx, LAD, RCA normal >> Med Rx  . Diabetes mellitus   . Essential hypertension 05/25/2019  . HFrEF and return of  normal LVF 05/25/2019   Echocardiogram 7/19: EF 10-15 // Echocardiogram 06/2018: EF 55-60 // Non-ischemic cardiomyopathy   . Mixed hyperlipidemia 05/25/2019  . Nonischemic cardiomyopathy 05/25/2019   Non-obs CAD by coronary CTA in 10/2018     Past Surgical History:  Procedure Laterality Date  . COLONOSCOPY WITH PROPOFOL N/A 01/07/2016   Procedure: COLONOSCOPY WITH PROPOFOL;  Surgeon: Jeffrey Bumpers, MD;  Location: [facility];  Service: Endoscopy;  Laterality: N/A;  . NO PAST SURGERIES       Home Medications:  Prior to Admission medications   Medication Sig Start Date End Date Taking? Authorizing Provider  amLODipine (NORVASC) 5 MG tablet Take 1 tablet by mouth daily. 08/15/20  Yes [provider]  aspirin EC 81 MG tablet Take 81 mg by mouth every evening.   Yes [provider]  carvedilol (COREG) 25 MG tablet Take 25 mg by mouth in the morning and at bedtime.   Yes [provider]  empagliflozin (JARDIANCE) 10 MG TABS tablet Take 10 mg by mouth daily.   Yes [provider]  glimepiride (AMARYL) 2 MG tablet Take 1 tablet by mouth daily. 08/18/18  Yes [provider]  metFORMIN (GLUCOPHAGE-XR) 500 MG 24 hr tablet Take 500 mg by mouth 2 (two) times daily. 10/06/18  Yes [provider]  Multiple Vitamins-Minerals (CENTRUM SILVER 50+MEN PO) Take 1 tablet by mouth daily.   Yes [provider]  rosuvastatin (CRESTOR) 20 MG tablet TAKE 1 TABLET BY MOUTH EVERY DAY 07/24/20  Yes Jeffrey, Deloris Ping, MD  sacubitril-valsartan (ENTRESTO) 49-51 MG Take 1 tablet by mouth 2 (two) times daily. 06/30/19  Yes Jeffrey, Deloris Ping, MD  Testosterone 20.25 MG/ACT (1.62%) GEL Apply 1 Pump topically See admin instructions. On each shoulder 07/20/20  Yes [provider]  spironolactone (ALDACTONE) 25 MG tablet TAKE 1 TABLET BY MOUTH EVERY DAY Patient not taking: No sig reported 11/23/19   Jeffrey, Deloris Ping, MD    Inpatient Medications: Scheduled Meds: .  enoxaparin (LOVENOX) injection  0.5 mg/kg Subcutaneous Q24H  . insulin aspart  0-20 Units Subcutaneous TID WC  . insulin aspart  0-5 Units Subcutaneous QHS   Continuous Infusions: . diltiazem (CARDIZEM) infusion Stopped (08/17/20 0329)  . diltiazem (CARDIZEM) infusion 2.5 mg/hr (08/17/20 0631)   PRN Meds: acetaminophen, ondansetron (ZOFRAN) IV  Allergies:   No Known Allergies  Social History:   Social History   Socioeconomic History  . Marital status: Married    Spouse name: Not on file  . Number of children: Not on file  . Years of education: Not on file  . Highest education level: Not on file  Occupational History  . Not on file  Tobacco Use  . Smoking status: Never Smoker  . Smokeless tobacco: Never Used  Vaping Use  . Vaping Use: Never used  Substance and Sexual Activity  . Alcohol use: Yes    Comment: 1 beer per day  . Drug use: No  . Sexual activity: Yes  Other Topics Concern  . Not on file  Social History Narrative  . Not on file   Social Determinants of Health   Financial Resource Strain: Not on file  Food Insecurity: Not on file  Transportation Needs: Not on file  Physical Activity: Not on file  Stress: Not on file  Social Connections: Not on file  Intimate Partner Violence: Not on file    Family History:    Family History  Problem Relation Age of Onset  . Hypertension Mother   . Hypertension Father   . CAD Brother      ROS:  Please see the history of present illness.   All other ROS reviewed and negative.     Physical Exam/Data:   Vitals:   08/17/20 0830 08/17/20 0845 08/17/20 0900 08/17/20 0956  BP: (!) 142/92 136/87 113/80 126/81  Pulse: 77 (!) 39 82 70  Resp: 17 18 17 19   Temp:    98 F (36.7 C)  TempSrc:    Oral  SpO2: 92% 91% 95% 98%  Weight:    115.7 kg  Height:    5\' 11"  (1.803 m)   No intake or output data in the 24 hours ending 08/17/20 1129 Last 3 Weights 08/17/2020 08/17/2020 06/24/2019  Weight (lbs) 255 lb 1.6 oz 255 lb  271 lb  Weight (kg) 115.713 kg 115.667 kg 122.925 kg     Body mass index is 35.58 kg/m.  General:  Well nourished, well developed, in no acute distress HEENT: normal Lymph: no adenopathy Neck: no JVD Endocrine:  No thryomegaly Vascular: No carotid bruits; FA pulses 2+ bilaterally without bruits  Cardiac:  normal S1, S2; RR, tachy; no murmur  Lungs:  clear to auscultation bilaterally, no wheezing, rhonchi or rales  Abd: soft, nontender, no hepatomegaly  Ext: no edema Musculoskeletal:  No deformities, BUE and BLE strength normal and equal Skin: warm and dry  Neuro:  CNs 2-12 intact,  no focal abnormalities noted Psych:  Normal affect   EKG:  The EKG was personally reviewed and demonstrates:  Afib, 118bpm Telemetry:  Telemetry was personally reviewed and demonstrates:  Afib rates around 100  Relevant CV Studies:  Echo 06/24/19  1. Left ventricular ejection fraction, by estimation, is 55%. The left  ventricle has normal function. The left ventricle demonstrates regional  wall motion abnormalities - borderline hypokinesis of the inferior wall.  There is moderate left ventricular  hypertrophy. Left ventricular diastolic parameters were normal.  2. Right ventricular systolic function is normal. The right ventricular  size is normal.  3. The mitral valve is normal in structure and function. No evidence of  mitral valve regurgitation. No evidence of mitral stenosis.  4. The aortic valve is tricuspid. Aortic valve regurgitation is not  visualized. No aortic stenosis is present.  5. The inferior vena cava is normal in size with greater than 50%  respiratory variability, suggesting right atrial pressure of 3 mmHg.   Lexiscan 06/24/19   The left ventricular ejection fraction is mildly decreased (45-54%).  Nuclear stress EF: 46%.  There was no ST segment deviation noted during stress.  This is a low risk study.   Abnormal, low risk stress nuclear study with no ischemia or  infarction; gated ejection fraction 46% with global hypokinesis and mild LVE.    Laboratory Data:  High Sensitivity Troponin:   Recent Labs  Lab 08/17/20 0011 08/17/20 0231  TROPONINIHS 9 13     Chemistry Recent Labs  Lab 08/17/20 0011  NA 139  K 3.8  CL 107  CO2 24  GLUCOSE 139*  BUN 19  CREATININE 1.13  CALCIUM 9.8  GFRNONAA >60  ANIONGAP 8    No results for input(s): PROT, ALBUMIN, AST, ALT, ALKPHOS, BILITOT in the last 168 hours. Hematology Recent Labs  Lab 08/17/20 0011  WBC 7.1  RBC 5.36  HGB 15.8  HCT 48.6  MCV 90.7  MCH 29.5  MCHC 32.5  RDW 13.8  PLT 203   BNP Recent Labs  Lab 08/17/20 0011  BNP 27.3    DDimer No results for input(s): DDIMER in the last 168 hours.   Radiology/Studies:  DG Chest 2 View  Result Date: 08/17/2020 CLINICAL DATA:  Shortness of breath EXAM: CHEST - 2 VIEW COMPARISON:  11/02/2017 FINDINGS: Cardiac shadow is enlarged but stable. The lungs are clear. Degenerative changes of the thoracic spine are noted. IMPRESSION: No active cardiopulmonary disease. Electronically Signed   By: Alcide Clever M.D.   On: 08/17/2020 02:21     Assessment and Plan:   New onset Afib  - presented with SOB and palpitations for 2 hours. HS trop and BNP negative. Rates 120s and started on IV cardizem. - CHADSVASC (HTN, DM2) 2, would qualify for a/c, likely Eliquis. HE is on Lovenox inj - rate control and if he doesn't convert might need TEE/DCCV - Keep Mag>2 and K>4 - check tsh - Normal EF by echo 05/2019. Will update - Patient is currently asymptomatic - Rates are better. Can likely transition to PO  HFrEF with normalization of EF  NICM - Echo as low as 10-15% in 2019 with most recent echo 05/2019 showed EF 55% - Euvolemic on exam - Entresto, spiro, coreg PTA>continue home meds - Echo ordered  HTN - PTA amlodipine 5mg  daily, coreg 25mg  BID, Entresto 49-51mg  BID, spiro 25mg  daily>held - Pressures soft at times. Restart as  able  DM2 - SSI per IM  For questions or updates, please contact [facility] Please consult [email or web address] for contact info under    Signed, Gillie Crisci David Stall, PA-C  08/17/2020 11:29 AM

## 2020-08-17 NOTE — ED Provider Notes (Signed)
[facility] Emergency Department Provider Note   ____________________________________________   Event Date/Time   First MD Initiated Contact with Patient 08/17/20 [phone]     (approximate)  I have reviewed the triage vital signs and the nursing notes.   HISTORY  Chief Complaint Hypertension, Shortness of Breath, and Tachycardia    HPI Jeffrey Lewis is a 60 y.o. male with the below stated past medical history who presents for shortness of breath and palpitations that occurred approximately 2 hours prior to his arrival.  Patient states that he woke up feeling his heart fluttering in his chest and associated shortness of breath that is worse with exertion.  Patient states that the symptoms are similar to when he was diagnosed with heart failure in the past.  Patient denies any chest pain/tightness only palpitations.  Patient denies any known history of atrial fibrillation or atrial flutter in the past.  Patient currently denies any vision changes, tinnitus, difficulty speaking, facial droop, sore throat, chest pain, abdominal pain, nausea/vomiting/diarrhea, dysuria, or weakness/numbness/paresthesias in any extremity         Past Medical History:  Diagnosis Date  . CAD (coronary artery disease) 05/25/2019   Cor CTA 10/2018:  RCA 0-25; LM 25-49; pLLAD 50-69; pLCx 25-49 / Ca score 297 / FFR in LM, LCx, LAD, RCA normal >> Med Rx  . Diabetes mellitus   . Essential hypertension 05/25/2019  . HFrEF and return of normal LVF 05/25/2019   Echocardiogram 7/19: EF 10-15 // Echocardiogram 06/2018: EF 55-60 // Non-ischemic cardiomyopathy   . Mixed hyperlipidemia 05/25/2019  . Nonischemic cardiomyopathy 05/25/2019   Non-obs CAD by coronary CTA in 10/2018     Patient Active Problem List   Diagnosis Date Noted  . Atrial fibrillation with rapid ventricular response, new onset (HCC) 08/17/2020  . Rapid atrial fibrillation (HCC) 08/17/2020  . DM (diabetes mellitus), type 2  with ophthalmic complications (HCC) 08/17/2020  . Heart failure with reduced ejection fraction and return of normal LVF 05/25/2019  . NICM (nonischemic cardiomyopathy) (HCC) 05/25/2019  . CAD (coronary artery disease) 05/25/2019  . Essential hypertension 05/25/2019  . Mixed hyperlipidemia 05/25/2019  . Type 2 diabetes mellitus without complication (HCC) 05/25/2019    Past Surgical History:  Procedure Laterality Date  . COLONOSCOPY WITH PROPOFOL N/A 01/07/2016   Procedure: COLONOSCOPY WITH PROPOFOL;  Surgeon: Charolett Bumpers, MD;  Location: [facility];  Service: Endoscopy;  Laterality: N/A;  . NO PAST SURGERIES      Prior to Admission medications   Medication Sig Start Date End Date Taking? Authorizing Provider  amLODipine (NORVASC) 5 MG tablet Take 1 tablet by mouth daily. 08/15/20  Yes [provider]  aspirin EC 81 MG tablet Take 81 mg by mouth every evening.   Yes [provider]  carvedilol (COREG) 25 MG tablet Take 25 mg by mouth in the morning and at bedtime.   Yes [provider]  empagliflozin (JARDIANCE) 10 MG TABS tablet Take 10 mg by mouth daily.   Yes [provider]  glimepiride (AMARYL) 2 MG tablet Take 1 tablet by mouth daily. 08/18/18  Yes [provider]  metFORMIN (GLUCOPHAGE-XR) 500 MG 24 hr tablet Take 500 mg by mouth 2 (two) times daily. 10/06/18  Yes [provider]  Multiple Vitamins-Minerals (CENTRUM SILVER 50+MEN PO) Take 1 tablet by mouth daily.   Yes [provider]  rosuvastatin (CRESTOR) 20 MG tablet TAKE 1 TABLET BY MOUTH EVERY DAY 07/24/20  Yes Nahser, Deloris Ping,  MD  sacubitril-valsartan (ENTRESTO) 49-51 MG Take 1 tablet by mouth 2 (two) times daily. 06/30/19  Yes Nahser, Deloris Ping, MD  Testosterone 20.25 MG/ACT (1.62%) GEL Apply 1 Pump topically See admin instructions. On each shoulder 07/20/20  Yes [provider]  spironolactone (ALDACTONE) 25 MG tablet TAKE 1 TABLET BY MOUTH EVERY  DAY Patient not taking: No sig reported 11/23/19   Nahser, Deloris Ping, MD    Allergies Patient has no known allergies.  Family History  Problem Relation Age of Onset  . Hypertension Mother   . Hypertension Father   . CAD Brother     Social History Social History   Tobacco Use  . Smoking status: Never Smoker  . Smokeless tobacco: Never Used  Vaping Use  . Vaping Use: Never used  Substance Use Topics  . Alcohol use: Yes    Comment: 1 beer per day  . Drug use: No    Review of Systems Constitutional: No fever/chills Eyes: No visual changes. ENT: No sore throat. Cardiovascular: Denies chest pain.  Endorses palpitations Respiratory: Endorses shortness of breath. Gastrointestinal: No abdominal pain.  No nausea, no vomiting.  No diarrhea. Genitourinary: Negative for dysuria. Musculoskeletal: Negative for acute arthralgias Skin: Negative for rash. Neurological: Negative for headaches, weakness/numbness/paresthesias in any extremity Psychiatric: Negative for suicidal ideation/homicidal ideation   ____________________________________________   PHYSICAL EXAM:  VITAL SIGNS: ED Triage Vitals  Enc Vitals Group     BP 08/17/20 0010 (!) 164/90     Pulse Rate 08/17/20 0010 (!) 111     Resp 08/17/20 0010 17     Temp 08/17/20 0010 98.1 F (36.7 C)     Temp Source 08/17/20 0010 Oral     SpO2 08/17/20 0010 97 %     Weight 08/17/20 0010 255 lb (115.7 kg)     Height 08/17/20 0010 5\' 11"  (1.803 m)     Head Circumference --      Peak Flow --      Pain Score 08/17/20 0027 0     Pain Loc --      Pain Edu? --      Excl. in GC? --    Constitutional: Alert and oriented. Well appearing and in no acute distress. Eyes: Conjunctivae are normal. PERRL. Head: Atraumatic. Nose: No congestion/rhinnorhea. Mouth/Throat: Mucous membranes are moist. Neck: No stridor Cardiovascular: Tachycardic irregularly irregular rhythm.  Grossly normal heart sounds.  Good peripheral  circulation. Respiratory: Normal respiratory effort.  No retractions. Gastrointestinal: Soft and nontender. No distention. Musculoskeletal: No obvious deformities Neurologic:  Normal speech and language. No gross focal neurologic deficits are appreciated. Skin:  Skin is warm and dry. No rash noted. Psychiatric: Mood and affect are normal. Speech and behavior are normal.  ____________________________________________   LABS (all labs ordered are listed, but only abnormal results are displayed)  Labs Reviewed  BASIC METABOLIC PANEL - Abnormal; Notable for the following components:      Result Value   Glucose, Bld 139 (*)    All other components within normal limits  RESP PANEL BY RT-PCR (FLU A&B, COVID) ARPGX2  CBC  BRAIN NATRIURETIC PEPTIDE  HEMOGLOBIN A1C  HIV ANTIBODY (ROUTINE TESTING W REFLEX)  TROPONIN I (HIGH SENSITIVITY)  TROPONIN I (HIGH SENSITIVITY)   ____________________________________________  EKG  ED ECG REPORT I, 08/19/20, the attending physician, personally viewed and interpreted this ECG.  Date: 08/17/2020 EKG Time: 0013 Rate: 118 Rhythm: Atrial fibrillation with rapid ventricular response QRS Axis: normal Intervals: normal ST/T Wave abnormalities:  normal Narrative Interpretation: no evidence of acute ischemia  ____________________________________________  RADIOLOGY  ED MD interpretation: One-view portable chest x-ray shows no evidence of acute abnormalities including no pneumonia, pneumothorax, or widened mediastinum  Official radiology report(s): DG Chest 2 View  Result Date: 08/17/2020 CLINICAL DATA:  Shortness of breath EXAM: CHEST - 2 VIEW COMPARISON:  11/02/2017 FINDINGS: Cardiac shadow is enlarged but stable. The lungs are clear. Degenerative changes of the thoracic spine are noted. IMPRESSION: No active cardiopulmonary disease. Electronically Signed   By: Alcide Clever M.D.   On: 08/17/2020 02:21     ____________________________________________   PROCEDURES  Procedure(s) performed (including Critical Care):  .1-3 Lead EKG Interpretation Performed by: Merwyn Katos, MD Authorized by: Merwyn Katos, MD     Interpretation: abnormal     ECG rate:  121   ECG rate assessment: tachycardic     Rhythm: atrial fibrillation     Ectopy: none     Conduction: normal       ____________________________________________   INITIAL IMPRESSION / ASSESSMENT AND PLAN / ED COURSE  As part of my medical decision making, I reviewed the following data within the electronic MEDICAL RECORD NUMBER Nursing notes reviewed and incorporated, Labs reviewed, EKG interpreted, Old chart reviewed, Radiograph reviewed and Notes from prior ED visits reviewed and incorporated        + atrial fibrillation w/ RVR DDx: Pneumothorax, Pneumonia, Pulmonary Embolus, Tamponade, ACS, Thyrotoxicosis.  No history or evidence decompensated heart failure. Given their history and exam it is likely this patient is unlikely to spontaneously revert to a rate controlled rhythm and necessitates a thorough workup for their arrhythmia. Workup: ECG, CXR, CBC, BMP, UA, Troponin, BNP, TSH, Ca-Mag-Phos Interventions: Defer Cardioversion (uncertain historical reliability with time of onset, increased risk of thromboembolic stroke).  Start diltiazem bolus and drip   Disposition: Admit      ____________________________________________   FINAL CLINICAL IMPRESSION(S) / ED DIAGNOSES  Final diagnoses:  Atrial fibrillation with rapid ventricular response, new onset [facility])     ED Discharge Orders         Ordered    Amb referral to [facility]IB [facility]        08/17/20 0322           Note:  This document was prepared using Dragon voice recognition software and may include unintentional dictation errors.   Merwyn Katos, MD 08/17/20 [phone]

## 2020-08-17 NOTE — ED Triage Notes (Signed)
Pt arrived via POV with reports of feeling short of breath and heart racing while laying down. Pt states he took his BP and was elevated as well.  On arrival EKG was shown to be in a-fib RVR without history.

## 2020-08-17 NOTE — H&P (Signed)
History and Physical    Jeffrey Lewis:[id number] DOB: [birth year] DOA: 08/17/2020  PCP: [place], MD   Patient coming from: Home  I have personally briefly reviewed patient's old medical records in [facility]  Chief Complaint: Shortness of breath, palpitations  HPI: Jeffrey Lewis is a 60 y.o. 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  He denied chest pain, cough, fever or chills and denied lower extremity pain or swelling.  Patient and wife state that he was under a lot of stress lately.  Stated he had increased his liquor consumption from baseline but currently just drinks 2 days out of the week and might take 2 drinks when he does drink. ED Course: On arrival he was afebrile with BP 164/90 pulse 111 with respirations of 17 and O2 sat 97% on room air.  Labs unremarkable.  Troponin of 9. EKG reviewed and interpreted myself: A. fib at 118 with no acute ST-T wave changes Imaging: Chest x-ray, no acute cardiopulmonary disease  Patient was started on a diltiazem infusion.  Hospitalist consulted for admission.    Review of Systems: As per HPI otherwise all other systems on review of systems negative.    Past Medical History:  Diagnosis Date  . CAD (coronary artery disease) 05/25/2019   Cor CTA 10/2018:  RCA 0-25; LM 25-49; pLLAD 50-69; pLCx 25-49 / Ca score 297 / FFR in LM, LCx, LAD, RCA normal >> Med Rx  . Diabetes mellitus   . Essential hypertension 05/25/2019  . HFrEF and return of normal LVF 05/25/2019   Echocardiogram 7/19: EF 10-15 // Echocardiogram 06/2018: EF 55-60 // Non-ischemic cardiomyopathy   . Mixed hyperlipidemia 05/25/2019  . Nonischemic  cardiomyopathy 05/25/2019   Non-obs CAD by coronary CTA in 10/2018     Past Surgical History:  Procedure Laterality Date  . COLONOSCOPY WITH PROPOFOL N/A 01/07/2016   Procedure: COLONOSCOPY WITH PROPOFOL;  Surgeon: Charolett Bumpers, MD;  Location: [facility];  Service: Endoscopy;  Laterality: N/A;  . NO PAST SURGERIES       reports that he has never smoked. He has never used smokeless tobacco. He reports current alcohol use. He reports that he does not use drugs.  No Known Allergies  Family History  Problem Relation Age of Onset  . Hypertension Mother   . Hypertension Father   . CAD Brother       Prior to Admission medications   Medication Sig Start Date End Date Taking? Authorizing Provider  aspirin EC 81 MG tablet Take 81 mg by mouth every evening.    [provider]  carvedilol (COREG) 6.25 MG tablet TAKE 1 TABLET BY MOUTH TWICE A DAY Patient taking differently: Take by mouth in the morning and at bedtime. 02/01/19   Nahser, Deloris Ping, MD  empagliflozin (JARDIANCE) 10 MG TABS tablet Take 10 mg by mouth daily.    [provider]  glimepiride (AMARYL) 2 MG tablet Take 1 tablet by mouth daily. 08/18/18   [provider]  metFORMIN (GLUCOPHAGE-XR) 500 MG 24 hr tablet Take 1,000 mg by mouth daily. 10/06/18   [provider]  Multiple Vitamins-Minerals (CENTRUM SILVER 50+MEN PO) Take 1 tablet by mouth daily.  [provider]  rosuvastatin (CRESTOR) 20 MG tablet TAKE 1 TABLET BY MOUTH EVERY DAY 07/24/20   Nahser, Deloris Ping, MD  sacubitril-valsartan (ENTRESTO) 49-51 MG Take 1 tablet by mouth 2 (two) times daily. 06/30/19   Nahser, Deloris Ping, MD  spironolactone (ALDACTONE) 25 MG tablet TAKE 1 TABLET BY MOUTH EVERY DAY 11/23/19   Nahser, Deloris Ping, MD    Physical Exam: Vitals:   08/17/20 0010 08/17/20 0100 08/17/20 0200  BP: (!) 164/90 123/73 108/65  Pulse: (!) 111 (!) 101 86  Resp: 17 (!) 24 (!) 23  Temp: 98.1 F (36.7 C)    TempSrc: Oral     SpO2: 97% 97% 96%  Weight: 115.7 kg    Height: 5\' 11"  (1.803 m)       Vitals:   08/17/20 0010 08/17/20 0100 08/17/20 0200  BP: (!) 164/90 123/73 108/65  Pulse: (!) 111 (!) 101 86  Resp: 17 (!) 24 (!) 23  Temp: 98.1 F (36.7 C)    TempSrc: Oral    SpO2: 97% 97% 96%  Weight: 115.7 kg    Height: 5\' 11"  (1.803 m)        Constitutional: Alert and oriented x 3 . Not in any apparent distress HEENT:      Head: Normocephalic and atraumatic.         Eyes: PERLA, EOMI, Conjunctivae are normal. Sclera is non-icteric.       Mouth/Throat: Mucous membranes are moist.       Neck: Supple with no signs of meningismus. Cardiovascular:  Irregularly irregular, tachycardic. No murmurs, gallops, or rubs. 2+ symmetrical distal pulses are present . No JVD. No LE edema Respiratory: Respiratory effort normal .Lungs sounds clear bilaterally. No wheezes, crackles, or rhonchi.  Gastrointestinal: Soft, non tender, and non distended with positive bowel sounds.  Genitourinary: No CVA tenderness. Musculoskeletal: Nontender with normal range of motion in all extremities. No cyanosis, or erythema of extremities. Neurologic:  Face is symmetric. Moving all extremities. No gross focal neurologic deficits . Skin: Skin is warm, dry.  No rash or ulcers Psychiatric: Mood and affect are normal    Labs on Admission: I have personally reviewed following labs and imaging studies  CBC: Recent Labs  Lab 08/17/20 0011  WBC 7.1  HGB 15.8  HCT 48.6  MCV 90.7  PLT 203   Basic Metabolic Panel: Recent Labs  Lab 08/17/20 0011  NA 139  K 3.8  CL 107  CO2 24  GLUCOSE 139*  BUN 19  CREATININE 1.13  CALCIUM 9.8   GFR: Estimated Creatinine Clearance: 88.8 mL/min (by C-G formula based on SCr of 1.13 mg/dL). Liver Function Tests: No results for input(s): AST, ALT, ALKPHOS, BILITOT, PROT, ALBUMIN in the last 168 hours. No results for input(s): LIPASE, AMYLASE in the last 168 hours. No results for input(s):  AMMONIA in the last 168 hours. Coagulation Profile: No results for input(s): INR, PROTIME in the last 168 hours. Cardiac Enzymes: No results for input(s): CKTOTAL, CKMB, CKMBINDEX, TROPONINI in the last 168 hours. BNP (last 3 results) No results for input(s): PROBNP in the last 8760 hours. HbA1C: No results for input(s): HGBA1C in the last 72 hours. CBG: No results for input(s): GLUCAP in the last 168 hours. Lipid Profile: No results for input(s): CHOL, HDL, LDLCALC, TRIG, CHOLHDL, LDLDIRECT in the last 72 hours. Thyroid Function Tests: No results for input(s): TSH, T4TOTAL, FREET4, T3FREE, THYROIDAB in the last 72 hours. Anemia Panel: No results for input(s): VITAMINB12, FOLATE, FERRITIN, TIBC,  IRON, RETICCTPCT in the last 72 hours. Urine analysis: No results found for: COLORURINE, APPEARANCEUR, LABSPEC, PHURINE, GLUCOSEU, HGBUR, BILIRUBINUR, KETONESUR, PROTEINUR, UROBILINOGEN, NITRITE, LEUKOCYTESUR  Radiological Exams on Admission: DG Chest 2 View  Result Date: 08/17/2020 CLINICAL DATA:  Shortness of breath EXAM: CHEST - 2 VIEW COMPARISON:  11/02/2017 FINDINGS: Cardiac shadow is enlarged but stable. The lungs are clear. Degenerative changes of the thoracic spine are noted. IMPRESSION: No active cardiopulmonary disease. Electronically Signed   By: Alcide Clever M.D.   On: 08/17/2020 02:21     Assessment/Plan 60 year old male with history of DM with retinopathy, HTN, CHF secondary to nonischemic cardiomyopathy with most recent EF 55% 05/2019, improved from 10-15% 10/2017, presenting with sudden onset palpitations associated and shortness of breath     Atrial fibrillation with rapid ventricular response, new onset  - Patient presenting with sudden onset palpitations with EKG showing rapid A. fib in the 1 teens - Continue diltiazem infusion started in the ER and titrate to keep rate below 110  -Continue home Coreg - Continuous cardiac monitoring - Echocardiogram in the a.m. -  CHA2DS2-VASc score of 5 so will benefit from systemic anticoagulation for stroke prevention pending further discussion on risk benefits and insurance coverage - Lovenox subcu for DVT prophylaxis pending initiation of systemic anticoagulant - Cardiology consult    Heart failure with reduced ejection fraction and return of normal LVF   NICM (nonischemic cardiomyopathy) (HCC) - Patient with previous HFrEF and EF 10 to 15% in 2019 with most recent EF February 2021 of 55% - Not in acute failure and appears euvolemic.  Chest x-ray nonacute - Takes Entresto, spironolactone and Coreg to resume pending med rec - Echocardiogram in the a.m. - Cardiology consult    Essential hypertension - Continue home meds once BP tolerates, pending med rec    DM (diabetes mellitus), type 2 with ophthalmic complications  - Sliding scale insulin.  Takes metformin, glimepiride and Jardiance    DVT prophylaxis: Lovenox  Code Status: full code  Family Communication:  none  Disposition Plan: Back to previous home environment Consults called: Cardiology Status:At the time of admission, it appears that the appropriate admission status for this patient is INPATIENT. This is judged to be reasonable and necessary in order to provide the required intensity of service to ensure the patient's safety given the presenting symptoms, physical exam findings, and initial radiographic and laboratory data in the context of their  Comorbid conditions.   Patient requires inpatient status due to high intensity of service, high risk for further deterioration and high frequency of surveillance required.   I certify that at the point of admission it is my clinical judgment that the patient will require inpatient hospital care spanning beyond 2 midnights     Andris Baumann MD [facility]     08/17/2020, 3:23 AM

## 2020-08-17 NOTE — ED Notes (Addendum)
Pt lying in bed awake and alert; oriented x 4.  RR even and unlabored on RA -- O2 sats stable 97% -- pt did report feeling sob earlier but states episode didn't last long; also states h/o CHF.  Cardiac monitoring ongoing-- Uncontrolled A-Fib fluctuating low 100s - 130s.  Abdomen soft nontender.  Skin warm dry and intact.  Will monitor for any acute changes and maintain plan of care.

## 2020-08-17 NOTE — Progress Notes (Signed)
*  PRELIMINARY RESULTS* Echocardiogram 2D Echocardiogram has been performed.  Jeffrey Lewis 08/17/2020, 1:38 PM

## 2020-08-17 NOTE — ED Notes (Signed)
Pt now to Xray via stretcher - no acute changes; pt remains awake and alert - wife at bedside

## 2020-08-17 NOTE — Progress Notes (Signed)
PHARMACIST - PHYSICIAN COMMUNICATION  CONCERNING:  Enoxaparin (Lovenox) for DVT Prophylaxis    RECOMMENDATION: Patient was prescribed enoxaprin 40mg  q24 hours for VTE prophylaxis.   Filed Weights   08/17/20 0010  Weight: 115.7 kg (255 lb)    Body mass index is 35.57 kg/m.  Estimated Creatinine Clearance: 88.8 mL/min (by C-G formula based on SCr of 1.13 mg/dL).   Based on [facility] policy patient is candidate for enoxaparin 0.5mg /kg TBW SQ every 24 hours based on BMI being >30.  DESCRIPTION: Pharmacy has adjusted enoxaparin dose per [facility] policy.  Patient is now receiving enoxaparin 0.5 mg/kg every 24 hours   [facility], PharmD, [facility] 08/17/2020 3:26 AM

## 2020-08-17 NOTE — ED Notes (Signed)
Pt now sitting up to edge of bed; no acute distress.  Triage nurse placing pt on cardiac monitor at this time.

## 2020-08-18 DIAGNOSIS — I10 Essential (primary) hypertension: Secondary | ICD-10-CM

## 2020-08-18 DIAGNOSIS — I4891 Unspecified atrial fibrillation: Secondary | ICD-10-CM | POA: Diagnosis not present

## 2020-08-18 DIAGNOSIS — I428 Other cardiomyopathies: Secondary | ICD-10-CM | POA: Diagnosis not present

## 2020-08-18 LAB — CBC
HCT: 51.3 % (ref 39.0–52.0)
Hemoglobin: 17.2 g/dL — ABNORMAL HIGH (ref 13.0–17.0)
MCH: 29.6 pg (ref 26.0–34.0)
MCHC: 33.5 g/dL (ref 30.0–36.0)
MCV: 88.1 fL (ref 80.0–100.0)
Platelets: 201 10*3/uL (ref 150–400)
RBC: 5.82 MIL/uL — ABNORMAL HIGH (ref 4.22–5.81)
RDW: 14 % (ref 11.5–15.5)
WBC: 6 10*3/uL (ref 4.0–10.5)
nRBC: 0 % (ref 0.0–0.2)

## 2020-08-18 LAB — BASIC METABOLIC PANEL
Anion gap: 12 (ref 5–15)
BUN: 18 mg/dL (ref 8–23)
CO2: 22 mmol/L (ref 22–32)
Calcium: 9.3 mg/dL (ref 8.9–10.3)
Chloride: 104 mmol/L (ref 98–111)
Creatinine, Ser: 1.15 mg/dL (ref 0.61–1.24)
GFR, Estimated: >60 mL/min (ref >60)
Glucose, Bld: 140 mg/dL — ABNORMAL HIGH (ref 70–99)
Potassium: 3.6 mmol/L (ref 3.5–5.1)
Sodium: 138 mmol/L (ref 135–145)

## 2020-08-18 LAB — GLUCOSE, CAPILLARY
Glucose-Capillary: 142 mg/dL — ABNORMAL HIGH (ref 70–99)
Glucose-Capillary: 149 mg/dL — ABNORMAL HIGH (ref 70–99)

## 2020-08-18 LAB — MAGNESIUM: Magnesium: 2 mg/dL (ref 1.7–2.4)

## 2020-08-18 MED ORDER — RIVAROXABAN 20 MG PO TABS
20.0000 mg | ORAL_TABLET | Freq: Every day | ORAL | Status: DC
  Administered 2020-08-18: 20 mg via ORAL
  Filled 2020-08-18: qty 1

## 2020-08-18 MED ORDER — SPIRONOLACTONE 25 MG PO TABS: 1.0000 | ORAL_TABLET | Freq: Every day | ORAL | 0 refills | Status: DC

## 2020-08-18 MED ORDER — METOPROLOL TARTRATE 100 MG PO TABS: 100.0000 mg | ORAL_TABLET | Freq: Two times a day (BID) | ORAL | 0 refills | Status: DC

## 2020-08-18 MED ORDER — METOPROLOL TARTRATE 50 MG PO TABS
100.0000 mg | ORAL_TABLET | Freq: Two times a day (BID) | ORAL | Status: DC
  Administered 2020-08-18: 100 mg via ORAL
  Filled 2020-08-18: qty 2

## 2020-08-18 MED ORDER — RIVAROXABAN 20 MG PO TABS: 20.0000 mg | ORAL_TABLET | Freq: Every day | ORAL | 0 refills | Status: DC

## 2020-08-18 NOTE — Discharge Summary (Signed)
Physician Discharge Summary  Jeffrey Lewis HWK:[id number] DOB: 10-26-1960 DOA: 08/17/2020  PCP: Jeffrey Forest, MD  Admit date: 08/17/2020 Discharge date: 08/18/2020  Admitted From: home Disposition:  Home   Recommendations for Outpatient Follow-up:  1. Follow up with PCP in 1-2 weeks 2. F/u w/ cardio, Jeffrey Lewis or Jeffrey Lewis in 1 week   Home Health: no  Equipment/Devices:  Discharge Condition: stable  CODE STATUS: full  Diet recommendation: Heart Healthy / Carb Modified    Brief/Interim Summary: HPI was taken from Jeffrey Lewis: Jeffrey Lewis is a 60 y.o. 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  He denied chest pain, cough, fever or chills and denied lower extremity pain or swelling.  Patient and wife state that he was under a lot of stress lately.  Stated he had increased his liquor consumption from baseline but currently just drinks 2 days out of the week and might take 2 drinks when he does drink. ED Course: On arrival he was afebrile with BP 164/90 pulse 111 with respirations of 17 and O2 sat 97% on room air.  Labs unremarkable.  Troponin of 9. EKG reviewed and interpreted myself: A. fib at 118 with no acute ST-T wave changes Imaging: Chest x-ray, no acute cardiopulmonary disease  Patient was started on a diltiazem infusion.  Hospitalist consulted for admission.  Hospital course from Jeffrey Lewis 4/22-4/23/22: Pt presented w/ new onset a. fib w/ RVR and was started on IV diltiazem drip. The diltiazem drip was weaned off, pt's BB was switched to metoprolol & was increased to 100mg  BID and pt was started on xarelto as per cardio. Pt will f/u outpatient w/ cardio in 1  week to make possible plans for DCCV as per cardio. For more information, please see other progress/consult notes.   Discharge Diagnoses:  Principal Problem:   Atrial fibrillation with rapid ventricular response, new onset (HCC) Active Problems:   Heart failure with reduced ejection fraction and return of normal LVF   NICM (nonischemic cardiomyopathy) (HCC)   CAD (coronary artery disease)   Essential hypertension   DM (diabetes mellitus), type 2 with ophthalmic complications (HCC)  A. Fib: w/ RVR. New onset. D/c IV diltiazem drip and BB was changed to metoprolol and increased to 100mg  BID as per cardio. Continue on xarelto. Continue on tele. Echo shows EF 55%, diastolic function was indeterminate. Cardio following and recs apprec   Nonischemic cardiomyopathy: cannot further specify. Hx of EF 10-15% in 2019 and EF of 55% in Feb 2021. Continue on home dose of coreg, entresto, spironolactone. Cardio consulted  HTN: continue on coreg, entresto, spironolactone   DM2: likely poorly controlled. Continue on SSI w/ accuchecks.   Obesity: BMI 35.5. Complicates overall care and prognosis   Discharge Instructions  Discharge Instructions    Amb referral to [facility]   Complete by: As directed    Diet - low sodium heart healthy   Complete by: As directed    Diet Carb Modified   Complete by: As directed    Discharge instructions   Complete by: As directed    F/u w/ cardio, Jeffrey Lewis, in 1 week. F/u w/ PCP in 1-2 weeks   Increase activity slowly  Complete by: As directed      Allergies as of 08/18/2020   No Known Allergies     Medication List    STOP taking these medications   amLODipine 5 MG tablet Commonly known as: NORVASC   aspirin EC 81 MG tablet   carvedilol 25 MG tablet Commonly known as: COREG     TAKE these medications   CENTRUM SILVER 50+MEN PO Take 1 tablet by mouth daily.   empagliflozin 10 MG Tabs tablet Commonly known as: JARDIANCE Take 10 mg by mouth  daily.   Entresto 49-51 MG Generic drug: sacubitril-valsartan Take 1 tablet by mouth 2 (two) times daily.   glimepiride 2 MG tablet Commonly known as: AMARYL Take 1 tablet by mouth daily.   metFORMIN 500 MG 24 hr tablet Commonly known as: GLUCOPHAGE-XR Take 500 mg by mouth 2 (two) times daily.   metoprolol tartrate 100 MG tablet Commonly known as: LOPRESSOR Take 1 tablet (100 mg total) by mouth 2 (two) times daily.   rivaroxaban 20 MG Tabs tablet Commonly known as: XARELTO Take 1 tablet (20 mg total) by mouth daily.   rosuvastatin 20 MG tablet Commonly known as: CRESTOR TAKE 1 TABLET BY MOUTH EVERY DAY   spironolactone 25 MG tablet Commonly known as: ALDACTONE Take 1 tablet (25 mg total) by mouth daily.   Testosterone 20.25 MG/ACT (1.62%) Gel Apply 1 Pump topically See admin instructions. On each shoulder       No Known Allergies  Consultations:  cardio   Procedures/Studies: DG Chest 2 View  Result Date: 08/17/2020 CLINICAL DATA:  Shortness of breath EXAM: CHEST - 2 VIEW COMPARISON:  11/02/2017 FINDINGS: Cardiac shadow is enlarged but stable. The lungs are clear. Degenerative changes of the thoracic spine are noted. IMPRESSION: No active cardiopulmonary disease. Electronically Signed   By: Jeffrey Lewis M.D.   On: 08/17/2020 02:21   ECHOCARDIOGRAM COMPLETE  Result Date: 08/17/2020    ECHOCARDIOGRAM REPORT   Patient Name:   Jeffrey Lewis Date of Exam: 08/17/2020 Medical Rec #:  [id number]        Height:       71.0 in Accession #:    [id number]       Weight:       255.1 lb Date of Birth:  1961-04-15        BSA:          2.338 m Patient Age:    59 years         BP:           117/67 mmHg Patient Gender: M                HR:           90 bpm. Exam Location:  [facility] Procedure: 2D Echo, Color Doppler and Cardiac Doppler Indications:     Atrial Fibrillation I48.91  History:         Patient has prior history of Echocardiogram examinations, most                  recent  06/24/2019. Risk Factors:Hypertension and Diabetes.  Sonographer:     Jeffrey Lewis RDCS (AE) Referring Phys:  [id number] Jeffrey Lewis FURTH Diagnosing Phys: Jeffrey Nordmann MD  Sonographer Comments: No apical window. IMPRESSIONS  1. Left ventricular ejection fraction, by estimation, is 55 %. The left ventricle has normal function. The left ventricle has no regional wall motion abnormalities. There is moderate left ventricular hypertrophy. Left ventricular diastolic parameters are  indeterminate.  2. Right ventricular size and systolic function was not well visualized. The right ventricular size is grossly normal. Tricuspid regurgitation signal is inadequate for assessing PA pressure.  3. Left atrial size was mildly dilated.  4. There is mild dilatation of the aortic root, measuring 40 mm.  5. The inferior vena cava is normal in size with greater than 50% respiratory variability, suggesting right atrial pressure of 3 mmHg.  6. Rhythm is atrial fibrillation FINDINGS  Left Ventricle: Left ventricular ejection fraction, by estimation, is 55 %. The left ventricle has normal function. The left ventricle has no regional wall motion abnormalities. The left ventricular internal cavity size was normal in size. There is moderate left ventricular hypertrophy. Left ventricular diastolic parameters are indeterminate. Right Ventricle: The right ventricular size is normal. No increase in right ventricular wall thickness. Right ventricular systolic function was not well visualized. Tricuspid regurgitation signal is inadequate for assessing PA pressure. Left Atrium: Left atrial size was mildly dilated. Right Atrium: Right atrial size was normal in size. Pericardium: There is no evidence of pericardial effusion. Mitral Valve: The mitral valve is normal in structure. No evidence of mitral valve regurgitation. No evidence of mitral valve stenosis. Tricuspid Valve: The tricuspid valve is normal in structure. Tricuspid valve regurgitation is not  demonstrated. No evidence of tricuspid stenosis. Aortic Valve: The aortic valve is normal in structure. Aortic valve regurgitation is not visualized. No aortic stenosis is present. Pulmonic Valve: The pulmonic valve was normal in structure. Pulmonic valve regurgitation is not visualized. No evidence of pulmonic stenosis. Aorta: The aortic root is normal in size and structure. There is mild dilatation of the aortic root, measuring 40 mm. Venous: The inferior vena cava is normal in size with greater than 50% respiratory variability, suggesting right atrial pressure of 3 mmHg. IAS/Shunts: No atrial level shunt detected by color flow Doppler.  LEFT VENTRICLE PLAX 2D LVIDd:         4.22 cm LVIDs:         2.60 cm LV PW:         1.78 cm LV IVS:        1.33 cm LVOT diam:     2.10 cm LVOT Area:     3.46 cm  LEFT ATRIUM         Index LA diam:    4.50 cm 1.92 cm/m                        PULMONIC VALVE AORTA                 PV Vmax:        0.59 m/s Ao Root diam: 3.90 cm PV Peak grad:   1.4 mmHg                       RVOT Peak grad: 2 mmHg   SHUNTS Systemic Diam: 2.10 cm Jeffrey Nordmann MD Electronically signed by Jeffrey Nordmann MD Signature Date/Time: 08/17/2020/4:05:46 PM    Final        Subjective: Pt denies any complaints    Discharge Exam: Vitals:   08/18/20 0924 08/18/20 1134  BP:  (!) 128/91  Pulse: 99 (!) 105  Resp:  16  Temp:  98.4 F (36.9 C)  SpO2:  98%   Vitals:   08/18/20 0452 08/18/20 0741 08/18/20 0924 08/18/20 1134  BP: (!) 158/94 123/90  (!) 128/91  Pulse: (!) 103 71 99 (!)  105  Resp: 20 18  16   Temp: 97.8 F (36.6 C) 98 F (36.7 C)  98.4 F (36.9 C)  TempSrc: Oral Oral  Oral  SpO2: 96% 98%  98%  Weight:      Height:        General: Pt is alert, awake, not in acute distress Cardiovascular: irregularly irregular, no rubs, no gallops Respiratory: CTA bilaterally, no wheezing, no rhonchi Abdominal: Soft, NT, obese, bowel sounds + Extremities: no edema, no cyanosis    The  results of significant diagnostics from this hospitalization (including imaging, microbiology, ancillary and laboratory) are listed below for reference.     Microbiology: Recent Results (from the past 240 hour(s))  Resp Panel by RT-PCR (Flu A&B, Covid) Nasopharyngeal Swab     Status: None   Collection Time: 08/17/20  2:45 AM   Specimen: Nasopharyngeal Swab; Nasopharyngeal(NP) swabs in vial transport medium  Result Value Ref Range Status   SARS Coronavirus 2 by RT PCR NEGATIVE NEGATIVE Final    Comment: (NOTE) SARS-CoV-2 target nucleic acids are NOT DETECTED.  The SARS-CoV-2 RNA is generally detectable in upper respiratory specimens during the acute phase of infection. The lowest concentration of SARS-CoV-2 viral copies this assay can detect is 138 copies/mL. A negative result does not preclude SARS-Cov-2 infection and should not be used as the sole basis for treatment or other patient management decisions. A negative result may occur with  improper specimen collection/handling, submission of specimen other than nasopharyngeal swab, presence of viral mutation(s) within the areas targeted by this assay, and inadequate number of viral copies(<138 copies/mL). A negative result must be combined with clinical observations, patient history, and epidemiological information. The expected result is Negative.  Fact Sheet for Patients:  08/19/20  Fact Sheet for Healthcare Providers:  BloggerCourse.com  This test is no t yet approved or cleared by the [place] FDA and  has been authorized for detection and/or diagnosis of SARS-CoV-2 by FDA under an Emergency Use Authorization (EUA). This EUA will remain  in effect (meaning this test can be used) for the duration of the COVID-19 declaration under Section 564(b)(1) of the Act, 21 U.S.C.section 360bbb-3(b)(1), unless the authorization is terminated  or revoked sooner.        Influenza A by PCR NEGATIVE NEGATIVE Final   Influenza B by PCR NEGATIVE NEGATIVE Final    Comment: (NOTE) The Xpert Xpress SARS-CoV-2/FLU/RSV plus assay is intended as an aid in the diagnosis of influenza from Nasopharyngeal swab specimens and should not be used as a sole basis for treatment. Nasal washings and aspirates are unacceptable for Xpert Xpress SARS-CoV-2/FLU/RSV testing.  Fact Sheet for Patients: [place]  Fact Sheet for Healthcare Providers: BloggerCourse.com  This test is not yet approved or cleared by the [place] FDA and has been authorized for detection and/or diagnosis of SARS-CoV-2 by FDA under an Emergency Use Authorization (EUA). This EUA will remain in effect (meaning this test can be used) for the duration of the COVID-19 declaration under Section 564(b)(1) of the Act, 21 U.S.C. section 360bbb-3(b)(1), unless the authorization is terminated or revoked.  Performed at [facility], [address] [place], [place]      Labs: BNP (last 3 results) Recent Labs    08/17/20 0011  BNP 27.3   Basic Metabolic Panel: Recent Labs  Lab 08/17/20 0011 08/18/20 0503  NA 139 138  K 3.8 3.6  CL 107 104  CO2 24 22  GLUCOSE 139* 140*  BUN 19 18  CREATININE 1.13 1.15  CALCIUM 9.8 9.3  MG  --  2.0   Liver Function Tests: No results for input(s): AST, ALT, ALKPHOS, BILITOT, PROT, ALBUMIN in the last 168 hours. No results for input(s): LIPASE, AMYLASE in the last 168 hours. No results for input(s): AMMONIA in the last 168 hours. CBC: Recent Labs  Lab 08/17/20 0011 08/18/20 0503  WBC 7.1 6.0  HGB 15.8 17.2*  HCT 48.6 51.3  MCV 90.7 88.1  PLT 203 201   Cardiac Enzymes: No results for input(s): CKTOTAL, CKMB, CKMBINDEX, TROPONINI in the last 168 hours. BNP: Invalid input(s): POCBNP CBG: Recent Labs  Lab 08/17/20 1158 08/17/20 1728 08/17/20 2043 08/18/20 0742  08/18/20 1136  GLUCAP 140* 191* 204* 142* 149*   D-Dimer No results for input(s): DDIMER in the last 72 hours. Hgb A1c Recent Labs    08/17/20 0631  HGBA1C 6.4*   Lipid Profile No results for input(s): CHOL, HDL, LDLCALC, TRIG, CHOLHDL, LDLDIRECT in the last 72 hours. Thyroid function studies Recent Labs    08/17/20 0011  TSH 4.261   Anemia work up No results for input(s): VITAMINB12, FOLATE, FERRITIN, TIBC, IRON, RETICCTPCT in the last 72 hours. Urinalysis No results found for: COLORURINE, APPEARANCEUR, LABSPEC, PHURINE, GLUCOSEU, HGBUR, BILIRUBINUR, KETONESUR, PROTEINUR, UROBILINOGEN, NITRITE, LEUKOCYTESUR Sepsis Labs Invalid input(s): PROCALCITONIN,  WBC,  LACTICIDVEN Microbiology Recent Results (from the past 240 hour(s))  Resp Panel by RT-PCR (Flu A&B, Covid) Nasopharyngeal Swab     Status: None   Collection Time: 08/17/20  2:45 AM   Specimen: Nasopharyngeal Swab; Nasopharyngeal(NP) swabs in vial transport medium  Result Value Ref Range Status   SARS Coronavirus 2 by RT PCR NEGATIVE NEGATIVE Final    Comment: (NOTE) SARS-CoV-2 target nucleic acids are NOT DETECTED.  The SARS-CoV-2 RNA is generally detectable in upper respiratory specimens during the acute phase of infection. The lowest concentration of SARS-CoV-2 viral copies this assay can detect is 138 copies/mL. A negative result does not preclude SARS-Cov-2 infection and should not be used as the sole basis for treatment or other patient management decisions. A negative result may occur with  improper specimen collection/handling, submission of specimen other than nasopharyngeal swab, presence of viral mutation(s) within the areas targeted by this assay, and inadequate number of viral copies(<138 copies/mL). A negative result must be combined with clinical observations, patient history, and epidemiological information. The expected result is Negative.  Fact Sheet for Patients:   BloggerCourse.comhttps://www.fda.gov/media/152166/download  Fact Sheet for Healthcare Providers:  SeriousBroker.ithttps://www.fda.gov/media/152162/download  This test is no t yet approved or cleared by the [place] FDA and  has been authorized for detection and/or diagnosis of SARS-CoV-2 by FDA under an Emergency Use Authorization (EUA). This EUA will remain  in effect (meaning this test can be used) for the duration of the COVID-19 declaration under Section 564(b)(1) of the Act, 21 U.S.C.section 360bbb-3(b)(1), unless the authorization is terminated  or revoked sooner.       Influenza A by PCR NEGATIVE NEGATIVE Final   Influenza B by PCR NEGATIVE NEGATIVE Final    Comment: (NOTE) The Xpert Xpress SARS-CoV-2/FLU/RSV plus assay is intended as an aid in the diagnosis of influenza from Nasopharyngeal swab specimens and should not be used as a sole basis for treatment. Nasal washings and aspirates are unacceptable for Xpert Xpress SARS-CoV-2/FLU/RSV testing.  Fact Sheet for Patients: BloggerCourse.comhttps://www.fda.gov/media/152166/download  Fact Sheet for Healthcare Providers: SeriousBroker.ithttps://www.fda.gov/media/152162/download  This test is not yet approved or cleared by the [place] FDA and has been authorized for  detection and/or diagnosis of SARS-CoV-2 by FDA under an Emergency Use Authorization (EUA). This EUA will remain in effect (meaning this test can be used) for the duration of the COVID-19 declaration under Section 564(b)(1) of the Act, 21 U.S.C. section 360bbb-3(b)(1), unless the authorization is terminated or revoked.  Performed at [facility], [address], [place], [place] [address]      Time coordinating discharge: Over 30 minutes  SIGNED:   Charise Killian, MD  [facility] 08/18/2020, 1:58 PM Pager   If 7PM-7AM, please contact night-coverage

## 2020-08-18 NOTE — Discharge Instructions (Signed)

## 2020-08-18 NOTE — TOC Transition Note (Signed)
Transition of Care [facility]) - CM/SW Discharge Note   Patient Details  Name: Jeffrey Lewis MRN: [id number] Date of Birth: 26-Oct-1960  Transition of Care [facility] CM/SW Contact:  Bing Quarry, RN Phone Number: 08/18/2020, 2:15 PM   Clinical Narrative: 4/23 DC home/self. F/u w/ cardio, Dr. Mariah Milling, in 1 week. F/u w/ PCP in 1-2 weeks. Gabriel Cirri RN CM       Final next level of care: Home/Self Care Barriers to Discharge: No Barriers Identified   Patient Goals and CMS Choice        Discharge Placement                       Discharge Plan and Services                DME Arranged: N/A DME Agency: NA       HH Arranged: NA HH Agency: NA        Social Determinants of Health (SDOH) Interventions     Readmission Risk Interventions No flowsheet data found.

## 2020-08-18 NOTE — Progress Notes (Addendum)
Progress Note  Patient Name: Jeffrey Lewis Date of Encounter: 08/18/2020  [facility] Cardiologist: Kristeen Miss, MD   Subjective   Echo showed normal EF 55%. Patient is still in afib with rates 100-120. He is feeling much better today.   Inpatient Medications    Scheduled Meds: . enoxaparin (LOVENOX) injection  0.5 mg/kg Subcutaneous Q24H  . insulin aspart  0-20 Units Subcutaneous TID WC  . insulin aspart  0-5 Units Subcutaneous QHS  . metoprolol tartrate  50 mg Oral BID   Continuous Infusions:  PRN Meds: acetaminophen, ondansetron (ZOFRAN) IV   Vital Signs    Vitals:   08/17/20 1137 08/17/20 1600 08/17/20 1934 08/18/20 0452  BP: 117/67 128/76 128/76 (!) 158/94  Pulse: 90 82 (!) 106 (!) 103  Resp: 18 19 20 20   Temp: 98.3 F (36.8 C) 98.8 F (37.1 C) 98.6 F (37 C) 97.8 F (36.6 C)  TempSrc: Oral  Oral Oral  SpO2: 99% 98% 97% 96%  Weight:      Height:        Intake/Output Summary (Last 24 hours) at 08/18/2020 0728 Last data filed at 08/17/2020 2100 Gross per 24 hour  Intake 1357.08 ml  Output --  Net 1357.08 ml   Last 3 Weights 08/17/2020 08/17/2020 06/24/2019  Weight (lbs) 255 lb 1.6 oz 255 lb 271 lb  Weight (kg) 115.713 kg 115.667 kg 122.925 kg      Telemetry    Afib HR 100-120 - Personally Reviewed  ECG    No new - Personally Reviewed  Physical Exam   GEN: No acute distress.   Neck: No JVD Cardiac:Irreg Irreg, no murmurs, rubs, or gallops.  Respiratory: Clear to auscultation bilaterally. GI: Soft, nontender, non-distended  MS: No edema; No deformity. Neuro:  Nonfocal  Psych: Normal affect   Labs    High Sensitivity Troponin:   Recent Labs  Lab 08/17/20 0011 08/17/20 0231  TROPONINIHS 9 13      Chemistry Recent Labs  Lab 08/17/20 0011 08/18/20 0503  NA 139 138  K 3.8 3.6  CL 107 104  CO2 24 22  GLUCOSE 139* 140*  BUN 19 18  CREATININE 1.13 1.15  CALCIUM 9.8 9.3  GFRNONAA >60 >60  ANIONGAP 8 12      Hematology Recent Labs  Lab 08/17/20 0011 08/18/20 0503  WBC 7.1 6.0  RBC 5.36 5.82*  HGB 15.8 17.2*  HCT 48.6 51.3  MCV 90.7 88.1  MCH 29.5 29.6  MCHC 32.5 33.5  RDW 13.8 14.0  PLT 203 201    BNP Recent Labs  Lab 08/17/20 0011  BNP 27.3     DDimer No results for input(s): DDIMER in the last 168 hours.   Radiology    DG Chest 2 View  Result Date: 08/17/2020 CLINICAL DATA:  Shortness of breath EXAM: CHEST - 2 VIEW COMPARISON:  11/02/2017 FINDINGS: Cardiac shadow is enlarged but stable. The lungs are clear. Degenerative changes of the thoracic spine are noted. IMPRESSION: No active cardiopulmonary disease. Electronically Signed   By: Alcide Clever M.D.   On: 08/17/2020 02:21   ECHOCARDIOGRAM COMPLETE  Result Date: 08/17/2020    ECHOCARDIOGRAM REPORT   Patient Name:   Jeffrey Lewis Date of Exam: 08/17/2020 Medical Rec #:  [id number]        Height:       71.0 in Accession #:    [id number]       Weight:       255.1 lb  Date of Birth:  1961-04-03        BSA:          2.338 m Patient Age:    59 years         BP:           117/67 mmHg Patient Gender: M                HR:           90 bpm. Exam Location:  [facility] Procedure: 2D Echo, Color Doppler and Cardiac Doppler Indications:     Atrial Fibrillation I48.91  History:         Patient has prior history of Echocardiogram examinations, most                  recent 06/24/2019. Risk Factors:Hypertension and Diabetes.  Sonographer:     Cristela Blue RDCS (AE) Referring Phys:  [id number] Francee Nodal FURTH Diagnosing Phys: Julien Nordmann MD  Sonographer Comments: No apical window. IMPRESSIONS  1. Left ventricular ejection fraction, by estimation, is 55 %. The left ventricle has normal function. The left ventricle has no regional wall motion abnormalities. There is moderate left ventricular hypertrophy. Left ventricular diastolic parameters are indeterminate.  2. Right ventricular size and systolic function was not well visualized. The right ventricular size  is grossly normal. Tricuspid regurgitation signal is inadequate for assessing PA pressure.  3. Left atrial size was mildly dilated.  4. There is mild dilatation of the aortic root, measuring 40 mm.  5. The inferior vena cava is normal in size with greater than 50% respiratory variability, suggesting right atrial pressure of 3 mmHg.  6. Rhythm is atrial fibrillation FINDINGS  Left Ventricle: Left ventricular ejection fraction, by estimation, is 55 %. The left ventricle has normal function. The left ventricle has no regional wall motion abnormalities. The left ventricular internal cavity size was normal in size. There is moderate left ventricular hypertrophy. Left ventricular diastolic parameters are indeterminate. Right Ventricle: The right ventricular size is normal. No increase in right ventricular wall thickness. Right ventricular systolic function was not well visualized. Tricuspid regurgitation signal is inadequate for assessing PA pressure. Left Atrium: Left atrial size was mildly dilated. Right Atrium: Right atrial size was normal in size. Pericardium: There is no evidence of pericardial effusion. Mitral Valve: The mitral valve is normal in structure. No evidence of mitral valve regurgitation. No evidence of mitral valve stenosis. Tricuspid Valve: The tricuspid valve is normal in structure. Tricuspid valve regurgitation is not demonstrated. No evidence of tricuspid stenosis. Aortic Valve: The aortic valve is normal in structure. Aortic valve regurgitation is not visualized. No aortic stenosis is present. Pulmonic Valve: The pulmonic valve was normal in structure. Pulmonic valve regurgitation is not visualized. No evidence of pulmonic stenosis. Aorta: The aortic root is normal in size and structure. There is mild dilatation of the aortic root, measuring 40 mm. Venous: The inferior vena cava is normal in size with greater than 50% respiratory variability, suggesting right atrial pressure of 3 mmHg. IAS/Shunts:  No atrial level shunt detected by color flow Doppler.  LEFT VENTRICLE PLAX 2D LVIDd:         4.22 cm LVIDs:         2.60 cm LV PW:         1.78 cm LV IVS:        1.33 cm LVOT diam:     2.10 cm LVOT Area:     3.46 cm  LEFT  ATRIUM         Index LA diam:    4.50 cm 1.92 cm/m                        PULMONIC VALVE AORTA                 PV Vmax:        0.59 m/s Ao Root diam: 3.90 cm PV Peak grad:   1.4 mmHg                       RVOT Peak grad: 2 mmHg   SHUNTS Systemic Diam: 2.10 cm Julien Nordmann MD Electronically signed by Julien Nordmann MD Signature Date/Time: 08/17/2020/4:05:46 PM    Final     Cardiac Studies   Echo 08/17/20 1. Left ventricular ejection fraction, by estimation, is 55 %. The left  ventricle has normal function. The left ventricle has no regional wall  motion abnormalities. There is moderate left ventricular hypertrophy. Left  ventricular diastolic parameters  are indeterminate.  2. Right ventricular size and systolic function was not well visualized.  The right ventricular size is grossly normal. Tricuspid regurgitation  signal is inadequate for assessing PA pressure.  3. Left atrial size was mildly dilated.  4. There is mild dilatation of the aortic root, measuring 40 mm.  5. The inferior vena cava is normal in size with greater than 50%  respiratory variability, suggesting right atrial pressure of 3 mmHg.  6. Rhythm is atrial fibrillation   Patient Profile     60 y.o. male HTN, obesity, HLD, NICM with prior EF down to 10-15% with normalization by echo 2020 who is being seen today for the evaluation of new onset afib  Assessment & Plan    New onset Afib  - presented with SOB and palpitations for 2 hours. HS trop and BNP negative. Rates in the 120s and started on IV cardizem. - Rates improved and cardizem was transitioned to metoprolol 50mg  BID - Patient is currently asymptomatic - CHADSVASC (HTN, DM2) 2, would qualify for a/c, likely Eliquis. He is on Lovenox inj -  Keep Mag>2 and K>4 - tsh wnl - Echo this admission showed normal EF and mildly dilated LA, since no further work-up can switch to Eliquis - Rates suboptimally controlled. I will increase metoprolol to 100mg  BID - Continue rate control and can follow up as outpatient for possible cardioversion  HFrEF with normalization of EF  NICM - Echo as low as 10-15% in 2019 with most recent echo 05/2019 showed EF 55% - Echo this admission showed LVEF 55% - Euvolemic on exam - Entresto, spiro PTA>restart on d/c  HTN - PTA amlodipine 5mg  daily, coreg 25mg  BID, Entresto 49-51mg  BID, spiro 25mg  daily>meds held on admission, and pressures intermittently soft - started on metoprolol 50mg  BID>>will increase as above - Restart entresto and spiro as able  DM2 - SSI per IM  Mild dilation of aortic root - per echo this admission, measuring 25mm - follow with serial echo  For questions or updates, please contact [facility] Please consult [email or web address] for contact info under     Signed, Cadence 06/2019, PA-C  08/18/2020, 7:28 AM    History and all data above reviewed.  Patient examined.   The patient feels well.  No chest pain.  No palpitations, presyncope or dizziness.  No SOB.  I agree with the findings as above.  The patient exam reveals  UMP:[id number]  ,  Lungs: Clear  ,  Abd: Positive bowel sounds, no rebound no guarding, Ext No edema  .  All available labs, radiology testing, previous records reviewed. Agree with documented assessment and plan.   Atrial fib:  OK to discharge on beta blocker 100 mg metoprolol tartrate bid.  I will start Xarelto 20 mg daily.  No contraindication.  The patient can follow his HR at home and we can make med adjustments for rate control via MyChart or in the office.  We will arrange follow up and eventual DCCV.  I educated his wife and the patient at length.   Colsen Pratik Dalziel  12:38 PM  08/18/2020

## 2020-08-20 ENCOUNTER — Telehealth: Payer: Self-pay | Admitting: *Deleted

## 2020-08-20 ENCOUNTER — Other Ambulatory Visit: Payer: Self-pay | Admitting: Cardiovascular Disease

## 2020-08-20 NOTE — Telephone Encounter (Signed)
-----   Message from Cadence David Stall, PA-C sent at 08/18/2020 12:41 PM EDT ----- Regarding: Hospital follow-up Pt seen for new onset aib. Needs hospital follow-up in 1 week for possible cardioversion. Please call and schedule.  Thanks!

## 2020-08-20 NOTE — Telephone Encounter (Signed)
Patient contacted regarding discharge from [facility] on Saturday 08/18/20.  Patient understands to follow up with provider Dr. Elease Hashimoto on Friday 08/24/20 at 8:00 am at the [facility] office. Patient understands discharge instructions? yes Patient understands medications and regiment? Yes Patient understands to bring all medications to this visit? Yes   - Are you enrolled in My Chart ( Yes)

## 2020-08-23 ENCOUNTER — Encounter: Payer: Self-pay | Admitting: Cardiovascular Disease

## 2020-08-23 NOTE — Progress Notes (Signed)
Cardiology Office Note:    Date:  08/24/2020   ID:  Jeffrey Lewis, DOB 1961/02/22, MRN [id number]  PCP:  Harvest Forest, MD  Cardiologist:  Kristeen Miss, MD   Referring MD: Harvest Forest, MD   Chief Complaint  Patient presents with  . Congestive Heart Failure         Jeffrey Lewis is a 60 y.o. male with a hx of hypertension, obesity, hyperlipidemia.  He had episodes of shortness of breath that seem to be worsening.  He was scheduled for an echocardiogram.  The echo tech noticed that his ejection fraction was markedly reduced and he was worked into my schedule for further evaluation.  Several months of progressive dyspnea.     Worse several weeks ago  Developed a cough.  Saw Jeffrey Lewis who ordered an echo  Has ocasional 2 pillow orthopnea.     Has PND  Significant DOE . 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  On meds for several years.  Still eats some salty foods.   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  He does not know if he has apneic episodes. Does not have early morning somnolence.  Is have some sleepiness associated with some narcotic cough syrup that he received recently.  Was just started on Furosemide   A day  - doesn't seem to be helping much   November 24, 2017: Mr. 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  He has been on torsemide.  We started [facility] during his last office visit.  Does not eat much salty foods.   Leg swelling has resolved.   Been found to have an elevated PSA level.  There is a chance that he will need a prostate biopsy.  Wt today = 266 Wt on JUly 16 was 273 lbs.   December 22, 2017:  Jeffrey Lewis seen today for follow-up of his acute on chronic combined systolic and diastolic congestive heart failure. His weight today is 268 pounds.  He is tolerating Entresto fairly well.  We have started him on low-dose carvedilol. Feeling better and better.   January 29, 2018:  Jeffrey Lewis is seen today for follow-up of his chronic combined congestive heart failure.  He is tolerating Entresto at the low dose fairly well. Weight today is 274 pounds which is up 8 pounds from his last visit in August. No swelling  Breathing is better.     May 03, 2018: Jeffrey Lewis seen back today for a follow-up of his chronic systolic congestive heart failure. Today is 273 pounds which is 1 pound less than it was last visit. Is on Entresto 49-51 mg twice a day and seems to be tolerating it quite well. Has developed a rash behind his left calf.  Jan. 28, 2021  Doing well.  Breathing has improved. On entresto.   Now off the torsemide  Has some tingling in his arm.   intermittant No exertional cp or dyspnea.    Coronary CT angiogram performed November 05, 2018 reveals no significant coronary artery disease. FFR analysis also confirmed the absence of significant  coronary artery disease.  August 24, 2020: Jeffrey Lewis is seen today for follow up of his CHF Has had a Coronary CTA that showed mild CAD  He had inferior Q waves - lexiscan myoview did not show any ischemia or previous infarction Had palpitations several weeks ago  Was found to have atrial fib with RVR  CHADS2VASC  =  4  ( CHF,CAD, HTN, DM )  Metoprolol was increase, xarelto started  Had a stressful day at work .  Echo from August 17, 2020 shows normal LV function  Has been exercising  Getting an ellipitical    Past Medical History:  Diagnosis Date  . CAD (coronary artery disease) 05/25/2019   Cor CTA 10/2018:  RCA 0-25; LM 25-49; pLLAD 50-69; pLCx 25-49 / Ca score 297 / FFR in LM, LCx, LAD, RCA normal >> Med Rx  . Diabetes mellitus   . Essential  hypertension 05/25/2019  . HFrEF and return of normal LVF 05/25/2019   Echocardiogram 7/19: EF 10-15 // Echocardiogram 06/2018: EF 55-60 // Non-ischemic cardiomyopathy   . Mixed hyperlipidemia 05/25/2019  . Nonischemic cardiomyopathy 05/25/2019   Non-obs CAD by coronary CTA in 10/2018     Past Surgical History:  Procedure Laterality Date  . COLONOSCOPY WITH PROPOFOL N/A 01/07/2016   Procedure: COLONOSCOPY WITH PROPOFOL;  Surgeon: Charolett Bumpers, MD;  Location: [facility]COPY;  Service: Endoscopy;  Laterality: N/A;  . NO PAST SURGERIES      Current Medications: Current Meds  Medication Sig  . empagliflozin (JARDIANCE) 25 MG TABS tablet Take 1 tablet by mouth daily.  Marland Kitchen glimepiride (AMARYL) 2 MG tablet Take 1 tablet by mouth daily.  . metFORMIN (GLUCOPHAGE-XR) 500 MG 24 hr tablet Take 500 mg by mouth 2 (two) times daily.  . Multiple Vitamins-Minerals (CENTRUM SILVER 50+MEN PO) Take 1 tablet by mouth daily.  . rosuvastatin (CRESTOR) 40 MG tablet Take 1 tablet by mouth daily.  . sacubitril-valsartan (ENTRESTO) 49-51 MG Take 1 tablet by mouth 2 (two) times daily.  . Testosterone 20.25 MG/ACT (1.62%) GEL Apply 1 Pump topically See admin instructions. On each shoulder  . [DISCONTINUED] empagliflozin (JARDIANCE) 10 MG TABS tablet Take 10 mg by mouth daily.  . [DISCONTINUED] metoprolol tartrate (LOPRESSOR) 100 MG tablet Take 1 tablet (100 mg total) by mouth 2 (two) times daily.  . [DISCONTINUED] rivaroxaban (XARELTO) 20 MG TABS tablet Take 1 tablet (20 mg total) by mouth daily.  . [DISCONTINUED] rosuvastatin (CRESTOR) 20 MG tablet TAKE 1 TABLET BY MOUTH EVERY DAY  . [DISCONTINUED] spironolactone (ALDACTONE) 25 MG tablet Take 1 tablet (25 mg total) by mouth daily.     Allergies:   Patient has no known allergies.   Social History   Socioeconomic History  . Marital status: Married    Spouse name: Not on file  . Number of children: Not on file  . Years of education: Not on file  . Highest  education level: Not on file  Occupational History  . Not on file  Tobacco Use  . Smoking status: Never Smoker  . Smokeless tobacco: Never Used  Vaping Use  . Vaping Use: Never used  Substance and Sexual Activity  . Alcohol use: Yes    Comment: 1 beer per day  . Drug use: No  . Sexual activity: Yes  Other Topics Concern  . Not on file  Social History Narrative  . Not on file   Social Determinants of Health   Financial Resource Strain: Not on file  Food Insecurity:  Not on file  Transportation Needs: Not on file  Physical Activity: Not on file  Stress: Not on file  Social Connections: Not on file     Family History: The patient's family history includes CAD in his brother; Hypertension in his father and mother.  ROS:   Please see the history of present illness.     All other systems reviewed and are negative.  EKGs/Labs/Other Studies Reviewed:    The following studies were reviewed today:    Recent Labs: 11/07/2019: ALT 31 08/17/2020: B Natriuretic Peptide 27.3; TSH 4.261 08/18/2020: BUN 18; Creatinine, Ser 1.15; Hemoglobin 17.2; Magnesium 2.0; Platelets 201; Potassium 3.6; Sodium 138  Recent Lipid Panel    Component Value Date/Time   CHOL 181 11/07/2019 1047   TRIG 99 11/07/2019 1047   HDL 55 11/07/2019 1047   CHOLHDL 3.3 11/07/2019 1047   LDLCALC 108 (H) 11/07/2019 1047   Physical Exam: Blood pressure 122/70, pulse 63, height 5\' 11"  (1.803 m), weight 256 lb 6.4 oz (116.3 kg), SpO2 99 %.  GEN:  Well nourished, well developed in no acute distress HEENT: Normal NECK: No JVD; No carotid bruits LYMPHATICS: No lymphadenopathy CARDIAC: RRR , no murmurs, rubs, gallops RESPIRATORY:  Clear to auscultation without rales, wheezing or rhonchi  ABDOMEN: Soft, non-tender, non-distended MUSCULOSKELETAL:  No edema; No deformity  SKIN: Warm and dry NEUROLOGIC:  Alert and oriented x 3   EKG:    August 24, 2020:   NSR at 63    No ST or T wave changes.     ASSESSMENT:     1. Atrial fibrillation with rapid ventricular response, new onset (HCC)    PLAN:      1.  Acute on chronic combined CHF:     He is doing very well.  His ejection fraction remains normal.  Continue current medications.   2.  New onset atrial fibrillation: The patient was admitted to [facility] for palpitations and was found to have rapid atrial fibrillation.  He was started on Xarelto.  Metoprolol was increased.  He converted back to sinus rhythm on his own.Marland Kitchen  He feels quite a bit better.  We will continue Xarelto at the current dose.  Continue metoprolol at the new higher dose.  He is CHA2DS2-VASc score is 4..      2.  Obesity:     He is working on weight loss.  He will be getting an elliptical machine soon.   3.  Diabetes mellitus:        Medication Adjustments/Labs and Tests Ordered: Current medicines are reviewed at length with the patient today.  Concerns regarding medicines are outlined above.  Orders Placed This Encounter  Procedures  . EKG 12-Lead   Meds ordered this encounter  Medications  . metoprolol tartrate (LOPRESSOR) 100 MG tablet    Sig: Take 1 tablet (100 mg total) by mouth 2 (two) times daily.    Dispense:  180 tablet    Refill:  3  . rivaroxaban (XARELTO) 20 MG TABS tablet    Sig: Take 1 tablet (20 mg total) by mouth daily.    Dispense:  30 tablet    Refill:  3      Patient Instructions  Medication Instructions:  Your physician recommends that you continue on your current medications as directed. Please refer to the Current Medication list given to you today.  *If you need a refill on your cardiac medications before your next appointment, please call your pharmacy*  Lab Work: none If you have labs (blood work) drawn today and your tests are completely normal, you will receive your results only by: Marland Kitchen MyChart Message (if you have MyChart) OR . A paper copy in the mail If you have any lab test that is abnormal or we need to  change your treatment, we will call you to review the results.   Testing/Procedures: none   Follow-Up: At [facility], you and your health needs are our priority.  As part of our continuing mission to provide you with exceptional heart care, we have created designated Provider Care Teams.  These Care Teams include your primary Cardiologist (physician) and Advanced Practice Providers (APPs -  Physician Assistants and Nurse Practitioners) who all work together to provide you with the care you need, when you need it.   Your next appointment:   6 month(s)  The format for your next appointment:   In Person  Provider:   You may see Kristeen Miss, MD or one of the following Advanced Practice Providers on your designated Care Team:    Tereso Newcomer, PA-C  Chelsea Aus, [place]      Signed, Kristeen Miss, MD  08/24/2020 7:07 PM    [facility]

## 2020-08-24 ENCOUNTER — Ambulatory Visit: Payer: BC Managed Care – PPO | Admitting: Cardiovascular Disease

## 2020-08-24 ENCOUNTER — Other Ambulatory Visit: Payer: Self-pay

## 2020-08-24 ENCOUNTER — Encounter: Payer: Self-pay | Admitting: Cardiovascular Disease

## 2020-08-24 VITALS — BP 122/70 | HR 63 | Ht 71.0 in | Wt 256.4 lb

## 2020-08-24 DIAGNOSIS — I4891 Unspecified atrial fibrillation: Secondary | ICD-10-CM

## 2020-08-24 MED ORDER — METOPROLOL TARTRATE 100 MG PO TABS: 100.0000 mg | ORAL_TABLET | Freq: Two times a day (BID) | ORAL | 3 refills | Status: DC

## 2020-08-24 MED ORDER — RIVAROXABAN 20 MG PO TABS: 20.0000 mg | ORAL_TABLET | Freq: Every day | ORAL | 3 refills | Status: DC

## 2020-08-24 NOTE — Patient Instructions (Signed)
Medication Instructions:  Your physician recommends that you continue on your current medications as directed. Please refer to the Current Medication list given to you today.  *If you need a refill on your cardiac medications before your next appointment, please call your pharmacy*   Lab Work: none If you have labs (blood work) drawn today and your tests are completely normal, you will receive your results only by: . MyChart Message (if you have MyChart) OR . A paper copy in the mail If you have any lab test that is abnormal or we need to change your treatment, we will call you to review the results.   Testing/Procedures: none   Follow-Up: At [facility], you and your health needs are our priority.  As part of our continuing mission to provide you with exceptional heart care, we have created designated Provider Care Teams.  These Care Teams include your primary Cardiologist (physician) and Advanced Practice Providers (APPs -  Physician Assistants and Nurse Practitioners) who all work together to provide you with the care you need, when you need it.   Your next appointment:   6 month(s)  The format for your next appointment:   In Person  Provider:   You may see Philip Nahser, MD or one of the following Advanced Practice Providers on your designated Care Team:    Scott Weaver, PA-C  Vin Bhagat, PA-C   

## 2020-10-08 ENCOUNTER — Other Ambulatory Visit: Payer: Self-pay

## 2020-10-08 ENCOUNTER — Telehealth: Payer: Self-pay | Admitting: Cardiovascular Disease

## 2020-10-08 MED ORDER — ENTRESTO 49-51 MG PO TABS: 1.0000 | ORAL_TABLET | Freq: Two times a day (BID) | ORAL | 3 refills | Status: DC

## 2020-10-08 NOTE — Telephone Encounter (Signed)
Patient called and mentioned that the insurance needs for a specialist to prescribe medication. Dr. Orlie Dakin office stated that a PA needed to be filled out for the prescription to be filled. Patient mentioned that he needs a prescription sent because he only have a few days left before prescription runs out. Please call back

## 2020-10-08 NOTE — Telephone Encounter (Signed)
**Note De-Identified Jeffrey Lewis Obfuscation** I called the pt back and he stated that a PA is needed for his Entresto. I attempted a Entresto PA through covermymeds X 3 and no eligibly was found on this pt.each time. I called [facility] and they verified that I am using the correct ins info.  I then called [facility] and s/w Caryn Bee who transferred me to [place] who advised me that no PA is required for thie pts Entresto.  I check the pts chart and it appears that he needs a refill as last one was sent in in 06/2019. I e-scribed his Entresto RX to [facility] to fill.

## 2020-10-08 NOTE — Telephone Encounter (Signed)
RN attempted to call patient back to clarify which medication he was needing to discuss. No answer at this time.

## 2020-11-30 ENCOUNTER — Telehealth: Payer: Self-pay

## 2020-11-30 NOTE — Telephone Encounter (Signed)
**Note De-Identified Sallye Lunz Obfuscation** I started a Entresto PA through covermymeds. Key: [id number]

## 2020-12-03 NOTE — Telephone Encounter (Signed)
**Note De-Identified Jeffrey Lewis Obfuscation** Letter received Aniyla Harling fax from [facility] stating that they have approved the pts Entresto for coverage until 11/30/2021.  I have notified [facility] of this approval.

## 2021-02-02 ENCOUNTER — Other Ambulatory Visit: Payer: Self-pay | Admitting: Cardiovascular Disease

## 2021-02-04 NOTE — Telephone Encounter (Signed)
Xarelto 20 g refill request received. Pt is 60 years old, weight- 116.3 kg, Crea- 1.15 on 08/18/20, last seen by Dr. Elease Hashimoto on 08/24/20, Diagnosis-afib, CrCl- 109.56; Dose is appropriate based on dosing criteria. Will send in refill to requested pharmacy.

## 2021-04-11 ENCOUNTER — Other Ambulatory Visit: Payer: Self-pay | Admitting: Cardiovascular Disease

## 2021-05-02 DIAGNOSIS — E119 Type 2 diabetes mellitus without complications: Secondary | ICD-10-CM | POA: Insufficient documentation

## 2021-05-05 ENCOUNTER — Encounter: Payer: Self-pay | Admitting: Cardiovascular Disease

## 2021-05-05 NOTE — Progress Notes (Signed)
Cardiology Office Note:    Date:  05/06/2021   ID:  Jeffrey Lewis, DOB November 21, 1960, MRN [id number]  PCP:  Audley Hose, MD  Cardiologist:  Mertie Moores, MD   Referring MD: Audley Hose, MD   Chief Complaint  Patient presents with   Atrial Fibrillation   Congestive Heart Failure         Jeffrey Lewis is a 61 y.o. male with a hx of hypertension, obesity, hyperlipidemia.  He had episodes of shortness of breath that seem to be worsening.  He was scheduled for an echocardiogram.  The echo tech noticed that his ejection fraction was markedly reduced and he was worked into my schedule for further evaluation.  Several months of progressive dyspnea.     Worse several weeks ago  Developed a cough.  Saw [name] who ordered an echo  Has ocasional 2 pillow orthopnea.     Has PND  Significant DOE . 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  On meds for several years.  Still eats some salty foods.   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  He does not know if he has apneic episodes. Does not have early morning somnolence.  Is have some sleepiness associated with some narcotic cough syrup that he received recently.  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  He has been on torsemide.  We started [facility] during his last office visit.  Does not eat much salty foods.   Leg swelling has resolved.   Been found to have an elevated PSA level.  There is a chance that he will need a prostate biopsy.  Wt today = 266 Wt on JUly 16 was 273  lbs.   December 22, 2017:  Jeffrey Lewis seen today for follow-up of his acute on chronic combined systolic and diastolic congestive heart failure. His weight today is 268 pounds.  He is tolerating Entresto fairly well.  We have started him on low-dose carvedilol. Feeling better and better.   January 29, 2018:  Jeffrey Lewis is seen today for follow-up of his chronic combined congestive heart failure.  He is tolerating Entresto at the low dose fairly well. Weight today is 274 pounds which is up 8 pounds from his last visit in August. No swelling  Breathing is better.     May 03, 2018: Jeffrey Lewis seen back today for a follow-up of his chronic systolic congestive heart failure. Today is 273 pounds which is 1 pound less than it was last visit. Is on Entresto 49-51 mg twice a day and seems to be tolerating it quite well. Has developed a rash behind his left calf.  Jan. 28, 2021  Doing well.  Breathing has improved. On entresto.   Now off the torsemide  Has some tingling in his arm.   intermittant No exertional cp or dyspnea.    Coronary CT angiogram performed November 05, 2018 reveals no significant coronary artery disease. FFR analysis also confirmed  the absence of significant coronary artery disease.  August 24, 2020: Jeffrey Lewis is seen today for follow up of his CHF Has had a Coronary CTA that showed mild CAD  He had inferior Q waves - lexiscan myoview did not show any ischemia or previous infarction Had palpitations several weeks ago  Was found to have atrial fib with RVR  CHADS2VASC  =  4  ( CHF,CAD, HTN, DM )  Metoprolol was increase, xarelto started  Had a stressful day at work .  Echo from August 17, 2020 shows normal LV function  Has been exercising  Getting an ellipitical   Jan. 9, 2023 Jeffrey Lewis is seen for follow up of his CHF, obesity.  History of atrial fibrillation diagnosed at [facility].  He converted to sinus rhythm on his own.  He has been on Xarelto. 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   No bleeding issues from [employer] is 2   Past Medical History:  Diagnosis Date   CAD (coronary artery disease) 05/25/2019   Cor CTA 10/2018:  RCA 0-25; LM 25-49; pLLAD 50-69; pLCx 25-49 / Ca score 297 / FFR in LM, LCx, LAD, RCA normal >> Med Rx   Diabetes mellitus    Essential hypertension 05/25/2019   HFrEF and return of normal LVF 05/25/2019   Echocardiogram 7/19: EF 10-15 // Echocardiogram 06/2018: EF 55-60 // Non-ischemic cardiomyopathy    Mixed hyperlipidemia 05/25/2019   Nonischemic cardiomyopathy 05/25/2019   Non-obs CAD by coronary CTA in 10/2018     Past Surgical History:  Procedure Laterality Date   COLONOSCOPY WITH PROPOFOL N/A 01/07/2016   Procedure: COLONOSCOPY WITH PROPOFOL;  Surgeon: Garlan Fair, MD;  Location: [facility];  Service: Endoscopy;  Laterality: N/A;   NO PAST SURGERIES      Current Medications: Current Meds  Medication Sig   amLODipine (NORVASC) 10 MG tablet Take 10 mg by mouth daily.   empagliflozin (JARDIANCE) 25 MG TABS tablet Take 1 tablet by mouth daily.   ENTRESTO 49-51 MG TAKE 1 TABLET BY MOUTH TWICE A DAY   ezetimibe (ZETIA) 10 MG tablet Take 1 tablet by mouth daily.   glimepiride (AMARYL) 2 MG tablet Take 1 tablet by mouth daily.   metFORMIN (GLUCOPHAGE-XR) 500 MG 24 hr tablet Take 500 mg by mouth 2 (two) times daily.   metoprolol tartrate (LOPRESSOR) 100 MG tablet Take 1 tablet (100 mg total) by mouth 2 (two) times daily.   Multiple Vitamins-Minerals (CENTRUM SILVER 50+MEN PO) Take 1 tablet by mouth daily.   rosuvastatin (CRESTOR) 40 MG tablet Take 1 tablet by mouth daily.   Testosterone 20.25 MG/ACT (1.62%) GEL Apply 1 Pump topically See admin instructions. On each shoulder   XARELTO 20 MG TABS tablet TAKE 1 TABLET BY MOUTH EVERY DAY     Allergies:   Patient has no known allergies.   Social History   Socioeconomic History   Marital status: Married    Spouse name: Not on  file   Number of children: Not on file   Years of education: Not on file   Highest education level: Not on file  Occupational History   Not on file  Tobacco Use   Smoking status: Never   Smokeless tobacco: Never  Vaping Use   Vaping Use: Never used  Substance and Sexual Activity   Alcohol use: Yes    Comment: 1 beer per day   Drug  use: No   Sexual activity: Yes  Other Topics Concern   Not on file  Social History Narrative   Not on file   Social Determinants of Health   Financial Resource Strain: Not on file  Food Insecurity: Not on file  Transportation Needs: Not on file  Physical Activity: Not on file  Stress: Not on file  Social Connections: Not on file     Family History: The patient's family history includes CAD in his brother; Hypertension in his father and mother.  ROS:   Please see the history of present illness.     All other systems reviewed and are negative.  EKGs/Labs/Other Studies Reviewed:    The following studies were reviewed today:    Recent Labs: 08/17/2020: B Natriuretic Peptide 27.3; TSH 4.261 08/18/2020: BUN 18; Creatinine, Ser 1.15; Hemoglobin 17.2; Magnesium 2.0; Platelets 201; Potassium 3.6; Sodium 138  Recent Lipid Panel    Component Value Date/Time   CHOL 181 11/07/2019 1047   TRIG 99 11/07/2019 1047   HDL 55 11/07/2019 1047   CHOLHDL 3.3 11/07/2019 1047   LDLCALC 108 (H) 11/07/2019 1047    Physical Exam: Blood pressure 120/76, pulse 73, height 5\' 11"  (1.803 m), weight 257 lb 9.6 oz (116.8 kg), SpO2 99 %.  GEN:  Well nourished, well developed in no acute distress HEENT: Normal NECK: No JVD; No carotid bruits LYMPHATICS: No lymphadenopathy CARDIAC: RRR , no murmurs, rubs, gallops RESPIRATORY:  Clear to auscultation without rales, wheezing or rhonchi  ABDOMEN: Soft, non-tender, non-distended MUSCULOSKELETAL:  No edema; No deformity  SKIN: Warm and dry NEUROLOGIC:  Alert and oriented x 3    EKG:        ASSESSMENT:    No  diagnosis found.  PLAN:      1.  Acute on chronic combined CHF:      His ejection fraction is 55% by echocardiogram in April, 2022.  Continue current medications.  .   2.  New onset atrial fibrillation:  He has not had any recurrent atrial fibrillation.  Continue current dose of metoprolol.  Continue Xarelto He is CHA2DS2-VASc score is 4..      2.  Obesity:    Advised more exercise and calorie reduction/carbohydrate reduction.    3.  Diabetes mellitus:        Medication Adjustments/Labs and Tests Ordered: Current medicines are reviewed at length with the patient today.  Concerns regarding medicines are outlined above.  No orders of the defined types were placed in this encounter.  No orders of the defined types were placed in this encounter.    Patient Instructions  Medication Instructions:  Your physician recommends that you continue on your current medications as directed. Please refer to the Current Medication list given to you today.  *If you need a refill on your cardiac medications before your next appointment, please call your pharmacy*   Lab Work: none If you have labs (blood work) drawn today and your tests are completely normal, you will receive your results only by: [place] (if you have MyChart) OR A paper copy in the mail If you have any lab test that is abnormal or we need to change your treatment, we will call you to review the results.   Testing/Procedures: none   Follow-Up: At [facility], you and your health needs are our priority.  As part of our continuing mission to provide you with exceptional heart care, we have created designated Provider Care Teams.  These Care Teams  include your primary Cardiologist (physician) and Advanced Practice Providers (APPs -  Physician Assistants and Nurse Practitioners) who all work together to provide you with the care you need, when you need it.  We recommend signing up for the patient portal called  "MyChart".  Sign up information is provided on this After Visit Summary.  MyChart is used to connect with patients for Virtual Visits (Telemedicine).  Patients are able to view lab/test results, encounter notes, upcoming appointments, etc.  Non-urgent messages can be sent to your provider as well.   To learn more about what you can do with MyChart, go to [email or web address].    Your next appointment:   6 month(s)  The format for your next appointment:   In Person  Provider:   Richardson Dopp, PA-C     Then, Mertie Moores, MD will plan to see you again in 1 year(s).    Other Instructions     Signed, Mertie Moores, MD  05/06/2021 3:25 PM    [facility]

## 2021-05-06 ENCOUNTER — Ambulatory Visit: Payer: BC Managed Care – PPO | Admitting: Cardiovascular Disease

## 2021-05-06 ENCOUNTER — Encounter: Payer: Self-pay | Admitting: Cardiovascular Disease

## 2021-05-06 ENCOUNTER — Other Ambulatory Visit: Payer: Self-pay

## 2021-05-06 VITALS — BP 120/76 | HR 73 | Ht 71.0 in | Wt 257.6 lb

## 2021-05-06 DIAGNOSIS — I5020 Unspecified systolic (congestive) heart failure: Secondary | ICD-10-CM | POA: Diagnosis not present

## 2021-05-06 DIAGNOSIS — I4891 Unspecified atrial fibrillation: Secondary | ICD-10-CM | POA: Diagnosis not present

## 2021-05-06 NOTE — Patient Instructions (Signed)
Medication Instructions:  Your physician recommends that you continue on your current medications as directed. Please refer to the Current Medication list given to you today.  *If you need a refill on your cardiac medications before your next appointment, please call your pharmacy*   Lab Work: none If you have labs (blood work) drawn today and your tests are completely normal, you will receive your results only by: MyChart Message (if you have MyChart) OR A paper copy in the mail If you have any lab test that is abnormal or we need to change your treatment, we will call you to review the results.   Testing/Procedures: none   Follow-Up: At [facility], you and your health needs are our priority.  As part of our continuing mission to provide you with exceptional heart care, we have created designated Provider Care Teams.  These Care Teams include your primary Cardiologist (physician) and Advanced Practice Providers (APPs -  Physician Assistants and Nurse Practitioners) who all work together to provide you with the care you need, when you need it.  We recommend signing up for the patient portal called "MyChart".  Sign up information is provided on this After Visit Summary.  MyChart is used to connect with patients for Virtual Visits (Telemedicine).  Patients are able to view lab/test results, encounter notes, upcoming appointments, etc.  Non-urgent messages can be sent to your provider as well.   To learn more about what you can do with MyChart, go to [email or web address].    Your next appointment:   6 month(s)  The format for your next appointment:   In Person  Provider:   Tereso Newcomer, PA-C     Then, Kristeen Miss, MD will plan to see you again in 1 year(s).    Other Instructions

## 2021-06-04 ENCOUNTER — Other Ambulatory Visit: Payer: Self-pay | Admitting: Cardiovascular Disease

## 2021-06-04 MED ORDER — ENTRESTO 49-51 MG PO TABS: 1.0000 | ORAL_TABLET | Freq: Two times a day (BID) | ORAL | 3 refills | Status: DC

## 2021-06-04 MED ORDER — RIVAROXABAN 20 MG PO TABS: 20.0000 mg | ORAL_TABLET | Freq: Every day | ORAL | 5 refills | Status: DC

## 2021-06-04 NOTE — Telephone Encounter (Signed)
Pt last saw Dr Acie Fredrickson 05/06/21, last labs 08/18/20 Creat 1.15, age 61, weight 116.8, CrCl 110.03, based on specified criteria pt is on appropriate dosage of Xarelto 20mg  QD for afib.  Will refill rx.

## 2021-06-04 NOTE — Telephone Encounter (Signed)
°*  STAT* If patient is at the pharmacy, call can be transferred to refill team.   1. Which medications need to be refilled? (please list name of each medication and dose if known)   XARELTO 20 MG TABS tablet  ENTRESTO 49-51 MG    2. Which pharmacy/location (including street and city if local pharmacy) is medication to be sent to?[id number] [place] [place] [address]  3. Do they need a 30 day or 90 day supply? 30 ds

## 2021-09-17 DIAGNOSIS — E291 Testicular hypofunction: Secondary | ICD-10-CM | POA: Insufficient documentation

## 2021-10-11 ENCOUNTER — Other Ambulatory Visit: Payer: Self-pay | Admitting: Cardiovascular Disease

## 2022-01-02 DIAGNOSIS — I480 Paroxysmal atrial fibrillation: Secondary | ICD-10-CM | POA: Insufficient documentation

## 2022-01-02 NOTE — Progress Notes (Signed)
Cardiology Office Note:    Date:  01/03/2022   ID:  Jeffrey Lewis, DOB Feb 16, 1961, MRN [id number]  PCP:  Harvest Forest, MD  [facility] Providers Cardiologist:  Kristeen Miss, MD    Referring MD: Harvest Forest, MD   Chief Complaint:  F/u for CHF, AFib    Patient Profile: HFimpEF (heart failure with improved ejection fraction)  Non-ischemic cardiomyopathy  Echocardiogram 2019: EF 10-15 Echocardiogram 2020: EF 55-60  Coronary artery disease  Mild to mod non-obs CAD by CCTA in 2020  Paroxysmal atrial fibrillation  Hypertension  Diabetes mellitus  Obesity  Hyperlipidemia   Prior CV Studies: GATED SPECT MYO PERF W/LEXISCAN STRESS 1D 06/24/2019 Abnormal, low risk stress nuclear study with no ischemia or infarction; gated ejection fraction 46% with global hypokinesis and mild LVE.   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 Coronary calcium score of 297. 86 percentile  2. Normal coronary origin with right dominance. 3. Moderate CAD in the proximal LAD and mid LCX arteries. CAD RADS 3. Aggressive medical management is recommended. Additional analysis with CT FFR will be submitted.  CT CORONARY FRACTIONAL FLOW RESERVE DATA PREP 11/05/2018 1. Left Main: 0.98. 2. LAD: Proximal: 0.94, distal: 0.89. 3. LCX: Proximal: 0.92, distal: 0.86. 4. OM1: 0.81. 5. OM2: 0.83. 6. RCA: Proximal: 0.94, distal:0.93. 7. PDA: 0.80. 8. PLA: 0.89.  IMPRESSION: 1. CT FFR analysis didn't show any significant stenosis. Aggressive medical management is indicated.   History of Present Illness:   Jeffrey Lewis is a 61 y.o. male with the above problem list.  He was last seen by Dr. Elease Hashimoto in Jan 2023. He returns for f/u. He is  here alone. He is overall doing well without chest pain, shortness of breath, syncope, orthopnea. He has noted some dependent leg edema, especially on the right.         Past Medical History:  Diagnosis Date   CAD (coronary artery disease) 05/25/2019   Cor CTA 10/2018:  RCA 0-25; LM 25-49; pLLAD 50-69; pLCx 25-49 / Ca score 297 / FFR in LM, LCx, LAD, RCA normal >> Med Rx   Diabetes mellitus    Essential hypertension 05/25/2019   HFrEF and return of normal LVF 05/25/2019   Echocardiogram 7/19: EF 10-15 // Echocardiogram 06/2018: EF 55-60 // Non-ischemic cardiomyopathy    Mixed hyperlipidemia 05/25/2019   Nonischemic cardiomyopathy 05/25/2019   Non-obs CAD by coronary CTA in 10/2018    Current Medications: Current Meds  Medication Sig   amLODipine (NORVASC) 10 MG tablet Take 10 mg by mouth daily.   carvedilol (COREG) 12.5 MG tablet TAKE 1 TABLET TWICE A DAY BY ORAL ROUTE FOR 90 DAYS.   diclofenac Sodium (VOLTAREN) 1 % GEL APPLY 2 GRAMS TO THE AFFECTED AREA(S) BY TOPICAL ROUTE 4 TIMES PER DAY   empagliflozin (JARDIANCE) 25 MG TABS tablet Take 1 tablet by mouth daily.   ezetimibe (ZETIA) 10 MG tablet Take 1 tablet by mouth daily.   glimepiride (AMARYL) 2 MG tablet Take 1 tablet by mouth daily.   metFORMIN (GLUCOPHAGE-XR) 500 MG 24 hr tablet Take 500 mg by mouth 2 (two) times daily.   metoprolol tartrate (LOPRESSOR) 100 MG tablet TAKE 1 TABLET BY MOUTH TWICE A DAY  Multiple Vitamins-Minerals (CENTRUM SILVER 50+MEN PO) Take 1 tablet by mouth daily.   rivaroxaban (XARELTO) 20 MG TABS tablet Take 1 tablet (20 mg total) by mouth daily.   rosuvastatin (CRESTOR) 40 MG tablet Take 1 tablet by mouth daily.   sacubitril-valsartan (ENTRESTO) 49-51 MG Take 1 tablet by mouth 2 (two) times daily.   spironolactone (ALDACTONE) 25 MG tablet daily.    Allergies:   Patient has no known allergies.   Social History   Tobacco Use   Smoking status: Never   Smokeless tobacco: Never  Vaping Use   Vaping Use:  Never used  Substance Use Topics   Alcohol use: Yes    Comment: 1 beer per day   Drug use: No    Family Hx: The patient's family history includes CAD in his brother; Hypertension in his father and mother.  Review of Systems  Cardiovascular:  Negative for claudication.  Gastrointestinal:  Negative for hematochezia and melena.  Genitourinary:  Negative for hematuria.     EKGs/Labs/Other Test Reviewed:    EKG:  EKG is  ordered today.  The ekg ordered today demonstrates sinus rhythm, HR 61, left axis deviation, inferior Q waves, anterior Q waves, QTc 402, no change from prior tracing  Recent Labs: No results found for requested labs within last 365 days.   Recent Lipid Panel No results for input(s): "CHOL", "TRIG", "HDL", "VLDL", "LDLCALC", "LDLDIRECT" in the last 8760 hours.   Risk Assessment/Calculations/Metrics:    CHA2DS2-VASc Score = 4   This indicates a 4.8% annual risk of stroke. The patient's score is based upon: CHF History: 1 HTN History: 1 Diabetes History: 1 Stroke History: 0 Vascular Disease History: 1 Age Score: 0 Gender Score: 0             Physical Exam:    VS:  BP 122/80   Pulse 61   Ht 5\' 11"  (1.803 m)   Wt 261 lb 3.2 oz (118.5 kg)   SpO2 96%   BMI 36.43 kg/m     Wt Readings from Last 3 Encounters:  01/03/22 261 lb 3.2 oz (118.5 kg)  05/06/21 257 lb 9.6 oz (116.8 kg)  08/24/20 256 lb 6.4 oz (116.3 kg)    Constitutional:      Appearance: Healthy appearance. Not in distress.  Neck:     Vascular: JVD normal.  Pulmonary:     Effort: Pulmonary effort is normal.     Breath sounds: No wheezing. No rales.  Cardiovascular:     Normal rate. Regular rhythm. Normal S1. Normal S2.      Murmurs: There is no murmur.  Edema:    Peripheral edema present.    Pretibial: trace edema of the left pretibial area. Abdominal:     Palpations: Abdomen is soft.  Skin:    General: Skin is warm and dry.  Neurological:     General: No focal deficit present.      Mental Status: Alert and oriented to person, place and time.         ASSESSMENT & PLAN:   HFimpEF (heart failure with improved ejection fraction) EF was as low as 10-15 and has improved to normal. Last echocardiogram in 4/22 with EF 55. NYHA II. Volume status stable.  GDMT for CHF:  Continue - Metoprolol tartrate 100 mg twice daily, Entresto (Sacubitril/Valsartan) 49/51 mg twice daily, Jardiance 25 mg once daily, and Spironolactone 25 mg once daily. He has Coreg and Lopressor listed for his medications. I will confirm with him  that he is only taking Lopressor.   CAD (coronary artery disease) Mild to moderate nonobstructive coronary artery disease by CT in July 2020.  He is doing well without anginal symptoms.  He is not on aspirin as he is on Xarelto.  Continue Crestor 40 mg daily, Zetia 10 mg daily.  Paroxysmal atrial fibrillation (HCC) Maintaining normal sinus rhythm.  He is tolerating anticoagulation.  Continue Xarelto 20 mg daily.  I will obtain his most recent CBC and BMET from primary care.  Mixed hyperlipidemia Goal LDL of <70.  Continue Zetia 10 mg daily, Crestor 40 mg daily.  Obtain most recent lipids, LFTs from primary care.  Essential hypertension Blood pressure is well controlled.  Continue amlodipine 10 mg daily, metoprolol tartrate 100 mg twice daily, Entresto 49/51 mg twice daily, spironolactone 25 mg daily.  Type 2 diabetes mellitus without complication (HCC) He notes his most recent A1c was in the 5.5-6 range.  Venous insufficiency Mild dependent edema on the right noted.  We discussed the role for elevation and compression hose.           Dispo:  Return in about 6 months (around 07/04/2022) for Routine Follow Up, w/ Dr. Elease Hashimoto.   Medication Adjustments/Labs and Tests Ordered: Current medicines are reviewed at length with the patient today.  Concerns regarding medicines are outlined above.  Tests Ordered: Orders Placed This Encounter  Procedures   EKG 12-Lead    Medication Changes: No orders of the defined types were placed in this encounter.  Signed, Tereso Newcomer, PA-C  01/03/2022 12:29 PM    [facility] [address] [place], [place], [place]  [address] Phone: [phone]; Fax: [phone]

## 2022-01-03 ENCOUNTER — Ambulatory Visit: Payer: BC Managed Care – PPO | Attending: Physician Assistant | Admitting: Physician Assistant

## 2022-01-03 ENCOUNTER — Encounter: Payer: Self-pay | Admitting: Physician Assistant

## 2022-01-03 VITALS — BP 122/80 | HR 61 | Ht 71.0 in | Wt 261.2 lb

## 2022-01-03 DIAGNOSIS — I872 Venous insufficiency (chronic) (peripheral): Secondary | ICD-10-CM

## 2022-01-03 DIAGNOSIS — I5020 Unspecified systolic (congestive) heart failure: Secondary | ICD-10-CM | POA: Diagnosis not present

## 2022-01-03 DIAGNOSIS — I480 Paroxysmal atrial fibrillation: Secondary | ICD-10-CM | POA: Diagnosis not present

## 2022-01-03 DIAGNOSIS — I2510 Atherosclerotic heart disease of native coronary artery without angina pectoris: Secondary | ICD-10-CM

## 2022-01-03 DIAGNOSIS — E782 Mixed hyperlipidemia: Secondary | ICD-10-CM

## 2022-01-03 DIAGNOSIS — I10 Essential (primary) hypertension: Secondary | ICD-10-CM

## 2022-01-03 DIAGNOSIS — E119 Type 2 diabetes mellitus without complications: Secondary | ICD-10-CM

## 2022-01-03 NOTE — Assessment & Plan Note (Signed)
Maintaining normal sinus rhythm.  He is tolerating anticoagulation.  Continue Xarelto 20 mg daily.  I will obtain his most recent CBC and BMET from primary care.

## 2022-01-03 NOTE — Assessment & Plan Note (Signed)
He notes his most recent A1c was in the 5.5-6 range.

## 2022-01-03 NOTE — Patient Instructions (Addendum)
Medication Instructions:  Your physician recommends that you continue on your current medications as directed. Please refer to the Current Medication list given to you today.  *If you need a refill on your cardiac medications before your next appointment, please call your pharmacy*   Lab Work: None ordered  If you have labs (blood work) drawn today and your tests are completely normal, you will receive your results only by: MyChart Message (if you have MyChart) OR A paper copy in the mail If you have any lab test that is abnormal or we need to change your treatment, we will call you to review the results.   Testing/Procedures: None ordered   Follow-Up: At [facility], you and your health needs are our priority.  As part of our continuing mission to provide you with exceptional heart care, we have created designated Provider Care Teams.  These Care Teams include your primary Cardiologist (physician) and Advanced Practice Providers (APPs -  Physician Assistants and Nurse Practitioners) who all work together to provide you with the care you need, when you need it.  We recommend signing up for the patient portal called "MyChart".  Sign up information is provided on this After Visit Summary.  MyChart is used to connect with patients for Virtual Visits (Telemedicine).  Patients are able to view lab/test results, encounter notes, upcoming appointments, etc.  Non-urgent messages can be sent to your provider as well.   To learn more about what you can do with MyChart, go to [email or web address].    Your next appointment:   6 month(s)   07/04/22 10:20 ARRIVE A 10:05  The format for your next appointment:   In Person  Provider:   Kristeen Miss, MD     Other Instructions   Important Information About Sugar

## 2022-01-03 NOTE — Assessment & Plan Note (Addendum)
EF was as low as 10-15 and has improved to normal. Last echocardiogram in 4/22 with EF 55. NYHA II. Volume status stable.  GDMT for CHF:  Continue - Metoprolol tartrate 100 mg twice daily, Entresto (Sacubitril/Valsartan) 49/51 mg twice daily, Jardiance 25 mg once daily, and Spironolactone 25 mg once daily. He has Coreg and Lopressor listed for his medications. I will confirm with him that he is only taking Lopressor.

## 2022-01-03 NOTE — Assessment & Plan Note (Signed)
Goal LDL of <70.  Continue Zetia 10 mg daily, Crestor 40 mg daily.  Obtain most recent lipids, LFTs from primary care.

## 2022-01-03 NOTE — Assessment & Plan Note (Signed)
Mild to moderate nonobstructive coronary artery disease by CT in July 2020.  He is doing well without anginal symptoms.  He is not on aspirin as he is on Xarelto.  Continue Crestor 40 mg daily, Zetia 10 mg daily.

## 2022-01-03 NOTE — Assessment & Plan Note (Signed)
Mild dependent edema on the right noted.  We discussed the role for elevation and compression hose.

## 2022-01-03 NOTE — Assessment & Plan Note (Signed)
Blood pressure is well controlled.  Continue amlodipine 10 mg daily, metoprolol tartrate 100 mg twice daily, Entresto 49/51 mg twice daily, spironolactone 25 mg daily.

## 2022-01-09 NOTE — Telephone Encounter (Signed)
Please call pt and confirm if he is taking Coreg or Metoprolol tartrate. Tereso Newcomer, PA-C    01/09/2022 9:24 AM

## 2022-01-10 ENCOUNTER — Telehealth: Payer: Self-pay | Admitting: Physician Assistant

## 2022-01-10 ENCOUNTER — Telehealth: Payer: Self-pay | Admitting: *Deleted

## 2022-01-10 DIAGNOSIS — E782 Mixed hyperlipidemia: Secondary | ICD-10-CM

## 2022-01-10 NOTE — Telephone Encounter (Signed)
Call placed to pt regarding message below: Please call pt and confirm if he is taking Coreg or Metoprolol tartrate. Tereso Newcomer, PA-C   Left pt a message to call back.

## 2022-01-10 NOTE — Telephone Encounter (Signed)
Labs from primary care 11/29/21: TC 170, HDL 63, Trig 87, LDL 89, SCr 1.02, K 4.2, ALT ALT 30  Goal LDL < 70. LDL above goal despite being on Crestor 40, Zetia 10.  PLAN:  Please refer to [facility] for consideration of PCSK9i. Tereso Newcomer, PA-C    01/10/2022 1:19 PM

## 2022-01-10 NOTE — Telephone Encounter (Signed)
Call placed to pt regarding message below and pt advice request.  Left a message for pt to call back.

## 2022-01-13 NOTE — Telephone Encounter (Signed)
Pt returned my call regarding message below.  He has been made aware the referral has been placed and someone will contact him to schedule.

## 2022-01-13 NOTE — Telephone Encounter (Signed)
Pt returned my call and he is on Metoprolol 100 bid.  Carvedilol removed on previous phone encounter.

## 2022-01-13 NOTE — Telephone Encounter (Signed)
Pt is returning call.  

## 2022-02-25 ENCOUNTER — Telehealth: Payer: Self-pay | Admitting: Pharmacist

## 2022-02-25 ENCOUNTER — Encounter: Payer: Self-pay | Admitting: Pharmacist

## 2022-02-25 ENCOUNTER — Ambulatory Visit: Payer: BC Managed Care – PPO | Attending: Cardiology | Admitting: Pharmacist

## 2022-02-25 DIAGNOSIS — I428 Other cardiomyopathies: Secondary | ICD-10-CM

## 2022-02-25 DIAGNOSIS — E782 Mixed hyperlipidemia: Secondary | ICD-10-CM

## 2022-02-25 DIAGNOSIS — I2510 Atherosclerotic heart disease of native coronary artery without angina pectoris: Secondary | ICD-10-CM | POA: Diagnosis not present

## 2022-02-25 DIAGNOSIS — I5020 Unspecified systolic (congestive) heart failure: Secondary | ICD-10-CM

## 2022-02-25 MED ORDER — REPATHA SURECLICK 140 MG/ML ~~LOC~~ SOAJ: 1.0000 mL | SUBCUTANEOUS | 3 refills | Status: DC

## 2022-02-25 MED ORDER — ENTRESTO 49-51 MG PO TABS: 1.0000 | ORAL_TABLET | Freq: Two times a day (BID) | ORAL | 3 refills | Status: DC

## 2022-02-25 NOTE — Patient Instructions (Addendum)
It was nice meeting you today  We would like your LDL (bad cholesterol) to be less than 55  Please continue your rosuvastatin and ezetimibe for now  We would like to start a new medication called Repatha which you would inject once every 2 weeks  I will complete the prior authorization for you and contact you when it is approved  Once you start the medication we will recheck your cholesterol in 2-3 months  I recommend downloading a copay card from [email or web address]  I will contact you when your medications are approved  Karren Cobble, PharmD, BCACP, [place], [place] [address], [place], [place] [address] Phone: [phone]; Fax: [phone] 02/25/2022 10:42 AM

## 2022-02-25 NOTE — Telephone Encounter (Signed)
PA for Entresto and Repatha submitted.  Repatha key: [id number]. Repatha approved through 02/25/23

## 2022-02-25 NOTE — Telephone Encounter (Signed)
Entresto approved through 02/26/23

## 2022-02-25 NOTE — Progress Notes (Signed)
Patient ID: Jeffrey Lewis                 DOB: [birth year]                    MRN: [id number]     HPI: Jeffrey Lewis is a 61 y.o. male patient referred to lipid clinic by Jeffrey Lewis. PMH is significant for CAD, T2DM (last A1c 6.4), HTN, HLD, and CHF.  Currently managed on rosuvastratin and ezetimibe.  Patient presents today in good spirits. Reports compliance with rosuvastatin and ezetimibe with no adverse effects. 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 Diet is mixed. Eats out for lunch a few times a week, typically burgers and fries.  He has recently bought an exercise machine but he has not been using it yet.  Blood sugar this morning was 153. A1c has not been updated in over a year  Reports he has not started his Entresto yet. Pharmacy told him they were waiting on authorization from [place].  Current Medications:  Zetia 10mg  daily Rosuvastatin 40mg  daily  Intolerances: N/A  Risk Factors:  CAD CHF T2DM  LDL goal: <55  Labs: LDL 89, TC 170, HDL 63, Trigs 87 (11/29/21 at PCP)  Past Medical History:  Diagnosis Date   CAD (coronary artery disease) 05/25/2019   Cor CTA 10/2018:  RCA 0-25; LM 25-49; pLLAD 50-69; pLCx 25-49 / Ca score 297 / FFR in LM, LCx, LAD, RCA normal >> Med Rx   Diabetes mellitus    Essential hypertension 05/25/2019   HFrEF and return of normal LVF 05/25/2019   Echocardiogram 7/19: EF 10-15 // Echocardiogram 06/2018: EF 55-60 // Non-ischemic cardiomyopathy    Mixed hyperlipidemia 05/25/2019   Nonischemic cardiomyopathy 05/25/2019   Non-obs CAD by coronary CTA in 10/2018     Current Outpatient Medications on File Prior to Visit  Medication Sig Dispense Refill   amLODipine (NORVASC) 10 MG tablet Take 10 mg by mouth daily.     diclofenac Sodium (VOLTAREN) 1 % GEL APPLY 2 GRAMS TO THE AFFECTED AREA(S) BY TOPICAL ROUTE 4 TIMES PER DAY     empagliflozin (JARDIANCE) 25 MG TABS tablet Take 1  tablet by mouth daily.     ezetimibe (ZETIA) 10 MG tablet Take 1 tablet by mouth daily.     glimepiride (AMARYL) 2 MG tablet Take 1 tablet by mouth daily.     metFORMIN (GLUCOPHAGE-XR) 500 MG 24 hr tablet Take 500 mg by mouth 2 (two) times daily.     metoprolol tartrate (LOPRESSOR) 100 MG tablet TAKE 1 TABLET BY MOUTH TWICE A DAY 180 tablet 2   Multiple Vitamins-Minerals (CENTRUM SILVER 50+MEN PO) Take 1 tablet by mouth daily.     rivaroxaban (XARELTO) 20 MG TABS tablet Take 1 tablet (20 mg total) by mouth daily. 30 tablet 5   rosuvastatin (CRESTOR) 40 MG tablet Take 1 tablet by mouth daily.     sacubitril-valsartan (ENTRESTO) 49-51 MG Take 1 tablet by mouth 2 (two) times daily. 180 tablet 3   spironolactone (ALDACTONE) 25 MG tablet daily.     Testosterone 20.25 MG/ACT (1.62%) GEL Apply 1 Pump topically See admin instructions. On each shoulder (Patient not taking: Reported on 01/03/2022)     No current facility-administered medications on file prior to visit.    No Known Allergies  Assessment/Plan:  1. Hyperlipidemia - Patient last LDL 89 which is above goal of <55.  Aggressive goal due to DM, CHF, and CAD. Reports compliance with medications. Elevated LDL likely impacted by diet choices. Unclear how controlled his DM is. Advised to try to keep A1c <7 and FBG between 80-130.  Next therapy recommendation would be PCSK9i. Using [employer], educated patient on mechanism of action, storage, site selection, administration and possible adverse effects. Will complete PA and contact patient when approved. Will also complete PA for Entresto in case that was what the pharmacy was waiting on. Advised patient to download copay card from website. Will recheck LDL in 2-3 months. May be able to d/c ezetimibe at that time.  Continue rosuvastatin 40mg  daily Continue ezetimibe 10mg  daily Start Repatha 140mg  q 2 weeks Recheck lipid panel in 2-3 months  , PharmD, BCACP, CDCES, CPP [facility] [address], [place], [facility] [address] Phone: [phone]; Fax: [phone] 02/25/2022 11:15 AM

## 2022-03-31 ENCOUNTER — Encounter (HOSPITAL_BASED_OUTPATIENT_CLINIC_OR_DEPARTMENT_OTHER): Payer: Self-pay

## 2022-03-31 DIAGNOSIS — G4733 Obstructive sleep apnea (adult) (pediatric): Secondary | ICD-10-CM

## 2022-04-07 ENCOUNTER — Ambulatory Visit (HOSPITAL_BASED_OUTPATIENT_CLINIC_OR_DEPARTMENT_OTHER): Payer: BC Managed Care – PPO | Attending: Internal Medicine | Admitting: Internal Medicine

## 2022-04-07 VITALS — Ht 71.0 in | Wt 260.0 lb

## 2022-04-07 DIAGNOSIS — G4733 Obstructive sleep apnea (adult) (pediatric): Secondary | ICD-10-CM

## 2022-04-13 DIAGNOSIS — G4733 Obstructive sleep apnea (adult) (pediatric): Secondary | ICD-10-CM | POA: Diagnosis not present

## 2022-04-13 NOTE — Procedures (Signed)
    Patient Name: Jeffrey Lewis, Jeffrey Lewis Date: 04/07/2022 Gender: Male D.O.B: 1960-07-31 Age (years): 73 Referring Provider: Jamison Oka MD Height (inches): 71 Interpreting Physician: Jetty Duhamel MD, ABSM Weight (lbs): 259 RPSGT: [place] BMI: 36 MRN: [id number] Neck Size: 17.75  CLINICAL INFORMATION Sleep Study Type: HST Indication for sleep study: Other sleep disorder Epworth Sleepiness Score: N/A  SLEEP STUDY TECHNIQUE A multi-channel overnight portable sleep study was performed. The channels recorded were: nasal airflow, thoracic respiratory movement, and oxygen saturation with a pulse oximetry. Snoring was also monitored.  MEDICATIONS Patient self administered medications include: none reported.  SLEEP ARCHITECTURE Patient was studied for 364.5 minutes. The sleep efficiency was 100.0 % and the patient was supine for 0%. The arousal index was 0.0 per hour.  RESPIRATORY PARAMETERS The overall AHI was 54.5 per hour, with a central apnea index of 0 per hour. The oxygen nadir was 75% during sleep.  CARDIAC DATA Mean heart rate during sleep was 65.4 bpm.  IMPRESSIONS - Severe obstructive sleep apnea occurred during this study (AHI = 54.5/h). - Oxygen desaturation was noted during this study (Min O2 = 75%). Mean 96% - Patient snored 34%.  DIAGNOSIS - Obstructive Sleep Apnea (G47.33)  RECOMMENDATIONS - Suggest CPAP titration sleep study or autopap. Other options would be based on clinical judgment. - Avoid alcohol, sedatives and other CNS depressants that may worsen sleep apnea and disrupt normal sleep architecture. - Sleep hygiene should be reviewed to assess factors that may improve sleep quality. - Weight management and regular exercise should be initiated or continued.  [Electronically signed] 04/13/2022 10:26 AM  Jetty Duhamel MD, ABSM Diplomate, American Board of Sleep Medicine NPI: [id number]                          Jetty Duhamel Diplomate, American Board of Sleep Medicine  ELECTRONICALLY SIGNED ON:  04/13/2022, 10:26 AM [facility] PH: [phone]   FX: [phone] ACCREDITED BY THE AMERICAN ACADEMY OF SLEEP MEDICINE

## 2022-06-27 ENCOUNTER — Encounter: Payer: Self-pay | Admitting: Cardiovascular Disease

## 2022-06-27 ENCOUNTER — Telehealth: Payer: Self-pay | Admitting: Cardiovascular Disease

## 2022-06-27 DIAGNOSIS — I2510 Atherosclerotic heart disease of native coronary artery without angina pectoris: Secondary | ICD-10-CM

## 2022-06-27 DIAGNOSIS — E782 Mixed hyperlipidemia: Secondary | ICD-10-CM

## 2022-06-27 MED ORDER — REPATHA SURECLICK 140 MG/ML ~~LOC~~ SOAJ: 1.0000 mL | SUBCUTANEOUS | 3 refills | Status: DC

## 2022-06-27 NOTE — Telephone Encounter (Signed)
*  STAT* If patient is at the pharmacy, call can be transferred to refill team.   1. Which medications need to be refilled? (please list name of each medication and dose if known) Evolocumab (REPATHA SURECLICK) [id number] MG/ML SOAJ   2. Which pharmacy/location (including street and city if local pharmacy) is medication to be sent to?    [id number] [place] [place] [facility] [address]     3. Do they need a 30 day or 90 day supply? [address]

## 2022-06-27 NOTE — Telephone Encounter (Signed)
Error

## 2022-06-27 NOTE — Telephone Encounter (Signed)
Refill sent in

## 2022-07-01 ENCOUNTER — Other Ambulatory Visit: Payer: Self-pay | Admitting: Cardiovascular Disease

## 2022-07-03 ENCOUNTER — Encounter: Payer: Self-pay | Admitting: Cardiovascular Disease

## 2022-07-03 NOTE — Progress Notes (Unsigned)
Cardiology Office Note:    Date:  07/04/2022   ID:  [name], DOB 1960-08-08, MRN [id number]  PCP:  Audley Hose, MD  Cardiologist:  Mertie Moores, MD 259 lbs    Referring MD: Audley Hose, MD   Chief Complaint  Patient presents with   Atrial Fibrillation   Congestive Heart Failure              Jeffrey Lewis is a 62 y.o. male with a hx of hypertension, obesity, hyperlipidemia.  He had episodes of shortness of breath that seem to be worsening.  He was scheduled for an echocardiogram.  The echo tech noticed that his ejection fraction was markedly reduced and he was worked into my schedule for further evaluation.  Several months of progressive dyspnea.     Worse several weeks ago  Developed a cough.  Saw Jeffrey Lewis who ordered an echo  Has ocasional 2 pillow orthopnea.     Has PND  Significant DOE . 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  On meds for several years.  Still eats some salty foods.   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  He does not know if he has apneic episodes. Does not have early morning somnolence.  Is have some sleepiness associated with some narcotic cough syrup that he received recently.  Was just started on Furosemide   A day  - doesn't seem to be helping much   November 24, 2017: Mr. 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  He has been on torsemide.  We started [facility] during his last office visit.  Does not eat much salty foods.   Leg swelling has resolved.   Been found to have an elevated PSA level.  There is a chance that he will need a prostate biopsy.  Wt today = 266 Wt on  JUly 16 was 273 lbs.   December 22, 2017:  Jeffrey Lewis seen today for follow-up of his acute on chronic combined systolic and diastolic congestive heart failure. His weight today is 268 pounds.  He is tolerating Entresto fairly well.  We have started him on low-dose carvedilol. Feeling better and better.   January 29, 2018:  Jeffrey Lewis is seen today for follow-up of his chronic combined congestive heart failure.  He is tolerating Entresto at the low dose fairly well. Weight today is 274 pounds which is up 8 pounds from his last visit in August. No swelling  Breathing is better.     May 03, 2018: Jeffrey Lewis seen back today for a follow-up of his chronic systolic congestive heart failure. Today is 273 pounds which is 1 pound less than it was last visit. Is on Entresto 49-51 mg twice a day and seems to be tolerating it quite well. Has developed a rash behind his left calf.  Jan. 28, 2021  Doing well.  Breathing has improved. On entresto.   Now off the torsemide  Has some tingling in his arm.   intermittant No exertional cp or dyspnea.    Coronary CT angiogram performed November 05, 2018 reveals no  significant coronary artery disease. FFR analysis also confirmed the absence of significant coronary artery disease.  August 24, 2020: Jeffrey Lewis is seen today for follow up of his CHF Has had a Coronary CTA that showed mild CAD  He had inferior Q waves - lexiscan myoview did not show any ischemia or previous infarction Had palpitations several weeks ago  Was found to have atrial fib with RVR  CHADS2VASC  =  4  ( CHF,CAD, HTN, DM )  Metoprolol was increase, xarelto started  Had a stressful day at work .  Echo from August 17, 2020 shows normal LV function  Has been exercising  Getting an ellipitical   Jan. 9, 2023 [name] is seen for follow up of his CHF, obesity.  History of atrial fibrillation diagnosed at Jeffrey Lewis.  He converted to sinus rhythm on his own.  He has been on Xarelto. 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  On xarelto  Wt is 259 lbs  No cp, no dyspnea No palpitations to suggest recurrent atrial fib   Needs to restart his exercise       Past Medical History:  Diagnosis Date   CAD (coronary artery disease) 05/25/2019   Cor CTA 10/2018:  RCA 0-25; LM 25-49; pLLAD 50-69; pLCx 25-49 / Ca score 297 / FFR in LM, LCx, LAD, RCA normal >> Med Rx   Diabetes mellitus    Essential hypertension 05/25/2019   HFrEF and return of normal LVF 05/25/2019   Echocardiogram 7/19: EF 10-15 // Echocardiogram 06/2018: EF 55-60 // Non-ischemic cardiomyopathy    Mixed hyperlipidemia 05/25/2019   Nonischemic cardiomyopathy 05/25/2019   Non-obs CAD by coronary CTA in 10/2018     Past Surgical History:  Procedure Laterality Date   COLONOSCOPY WITH PROPOFOL N/A 01/07/2016   Procedure: COLONOSCOPY WITH PROPOFOL;  Surgeon: Garlan Fair, MD;  Location: [facility];  Service: Endoscopy;  Laterality: N/A;   NO PAST SURGERIES      Current Medications: Current Meds  Medication Sig   empagliflozin (JARDIANCE) 25 MG TABS tablet Take 1 tablet by mouth daily.   Evolocumab (REPATHA SURECLICK) 347 MG/ML SOAJ Inject 140 mg into the skin every 14 (fourteen) days.   ezetimibe (ZETIA) 10 MG tablet Take 1 tablet by mouth daily.   metFORMIN (GLUCOPHAGE-XR) 500 MG 24 hr tablet Take 500 mg by mouth 2 (two) times daily.   metoprolol tartrate (LOPRESSOR) 100 MG tablet TAKE 1 TABLET BY MOUTH TWICE A DAY   Multiple Vitamins-Minerals (CENTRUM SILVER 50+MEN PO) Take 1 tablet by mouth daily.   rivaroxaban (XARELTO) 20 MG TABS tablet Take 1 tablet (20 mg total) by mouth daily.   rosuvastatin (CRESTOR) 40 MG tablet Take 1 tablet by mouth daily.   sacubitril-valsartan (ENTRESTO) 49-51 MG Take 1 tablet  by mouth 2 (two) times daily.   spironolactone (ALDACTONE) 25 MG tablet daily.     Allergies:   Patient has no known allergies.   Social History   Socioeconomic History   Marital status: Married    Spouse name: Not on file   Number of children: Not on file   Years of education: Not on file   Highest education level: Not on file  Occupational  History   Not on file  Tobacco Use   Smoking status: Never   Smokeless tobacco: Never  Vaping Use   Vaping Use: Never used  Substance and Sexual Activity   Alcohol use: Yes    Comment: 1 beer per day   Drug use: No   Sexual activity: Yes  Other Topics Concern   Not on file  Social History Narrative   Not on file   Social Determinants of Health   Financial Resource Strain: Not on file  Food Insecurity: Not on file  Transportation Needs: Not on file  Physical Activity: Not on file  Stress: Not on file  Social Connections: Not on file     Family History: The patient's family history includes CAD in his brother; Hypertension in his father and mother.  ROS:   Please see the history of present illness.     All other systems reviewed and are negative.  EKGs/Labs/Other Studies Reviewed:    The following studies were reviewed today:    Recent Labs: No results found for requested labs within last 365 days.  Recent Lipid Panel    Component Value Date/Time   CHOL 181 11/07/2019 1047   TRIG 99 11/07/2019 1047   HDL 55 11/07/2019 1047   CHOLHDL 3.3 11/07/2019 1047   LDLCALC 108 (H) 11/07/2019 1047    Physical Exam: Blood pressure 138/86, pulse 68, height 5\' 11"  (1.803 m), weight 259 lb (117.5 kg), SpO2 98 %.       GEN:  moderately obese middle age male  in no acute distress HEENT: Normal NECK: No JVD; No carotid bruits LYMPHATICS: No lymphadenopathy CARDIAC: RRR , no murmurs, rubs, gallops RESPIRATORY:  Clear to auscultation without rales, wheezing or rhonchi  ABDOMEN: Soft, non-tender,  non-distended MUSCULOSKELETAL:  No edema; No deformity  SKIN: Warm and dry NEUROLOGIC:  Alert and oriented x 3\    EKG:        ASSESSMENT:    1. Mixed hyperlipidemia   2. Essential hypertension   3. Chronic congestive heart failure, unspecified heart failure type ([place])     PLAN:      1.  Acute on chronic combined CHF:      stable,  last echo 08/17/20 showed normal LVEF, indeterminite diastolic function, Mildly dilated aortic root at 40 mm      2.  New onset atrial fibrillation:  He has not had any recurrent atrial fibrillation.  Continue current dose of metoprolol.  Continue Xarelto He is CHA2DS2-VASc score is 4..      2.  Obesity:    Advised more exercise and calorie reduction/carbohydrate reduction.    3.  Diabetes mellitus:        Medication Adjustments/Labs and Tests Ordered: Current medicines are reviewed at length with the patient today.  Concerns regarding medicines are outlined above.  No orders of the defined types were placed in this encounter.  No orders of the defined types were placed in this encounter.    Patient Instructions  Medication Instructions:  Your physician recommends that you continue on your current medications as directed. Please refer to the Current Medication list given to you today.  *If you need a refill on your cardiac medications before your next appointment, please call your pharmacy*   Lab Work: none If you have labs (blood work) drawn today and your tests are completely normal, you will receive your results only by: [place] (if you have MyChart) OR A paper copy in the mail If  you have any lab test that is abnormal or we need to change your treatment, we will call you to review the results.   Testing/Procedures: none   Follow-Up: At [facility], you and your health needs are our priority.  As part of our continuing mission to provide you with exceptional heart care, we have created designated  Provider Care Teams.  These Care Teams include your primary Cardiologist (physician) and Advanced Practice Providers (APPs -  Physician Assistants and Nurse Practitioners) who all work together to provide you with the care you need, when you need it.  We recommend signing up for the patient portal called "MyChart".  Sign up information is provided on this After Visit Summary.  MyChart is used to connect with patients for Virtual Visits (Telemedicine).  Patients are able to view lab/test results, encounter notes, upcoming appointments, etc.  Non-urgent messages can be sent to your provider as well.   To learn more about what you can do with MyChart, go to [email or web address].    Your next appointment:   6 month(s)  Provider:   Florene Glen, NP   Signed, Mertie Moores, MD  07/04/2022 10:54 AM    [place]

## 2022-07-04 ENCOUNTER — Encounter: Payer: Self-pay | Admitting: Cardiovascular Disease

## 2022-07-04 ENCOUNTER — Ambulatory Visit: Payer: BC Managed Care – PPO | Attending: Cardiovascular Disease | Admitting: Cardiovascular Disease

## 2022-07-04 VITALS — BP 138/86 | HR 68 | Ht 71.0 in | Wt 259.0 lb

## 2022-07-04 DIAGNOSIS — I10 Essential (primary) hypertension: Secondary | ICD-10-CM | POA: Diagnosis not present

## 2022-07-04 DIAGNOSIS — E782 Mixed hyperlipidemia: Secondary | ICD-10-CM | POA: Diagnosis not present

## 2022-07-04 DIAGNOSIS — I509 Heart failure, unspecified: Secondary | ICD-10-CM | POA: Diagnosis not present

## 2022-07-04 NOTE — Patient Instructions (Signed)
Medication Instructions:  Your physician recommends that you continue on your current medications as directed. Please refer to the Current Medication list given to you today.  *If you need a refill on your cardiac medications before your next appointment, please call your pharmacy*   Lab Work: none If you have labs (blood work) drawn today and your tests are completely normal, you will receive your results only by: [place] (if you have MyChart) OR A paper copy in the mail If you have any lab test that is abnormal or we need to change your treatment, we will call you to review the results.   Testing/Procedures: none   Follow-Up: At [facility], you and your health needs are our priority.  As part of our continuing mission to provide you with exceptional heart care, we have created designated Provider Care Teams.  These Care Teams include your primary Cardiologist (physician) and Advanced Practice Providers (APPs -  Physician Assistants and Nurse Practitioners) who all work together to provide you with the care you need, when you need it.  We recommend signing up for the patient portal called "MyChart".  Sign up information is provided on this After Visit Summary.  MyChart is used to connect with patients for Virtual Visits (Telemedicine).  Patients are able to view lab/test results, encounter notes, upcoming appointments, etc.  Non-urgent messages can be sent to your provider as well.   To learn more about what you can do with MyChart, go to [email or web address].    Your next appointment:   6 month(s)  Provider:   Florene Glen, NP

## 2022-08-29 ENCOUNTER — Other Ambulatory Visit: Payer: Self-pay | Admitting: Cardiovascular Disease

## 2022-10-23 IMAGING — CR DG CHEST 2V
1 series · 2 of 2 positions shown · non-contrast
Comparison: 11/02/2017

CLINICAL DATA: Shortness of breath

EXAM:
CHEST - 2 VIEW

[Series 1: dg chest 2 view · 0.14mm/px · 2 of 2 slices shown]
[im 1/2]
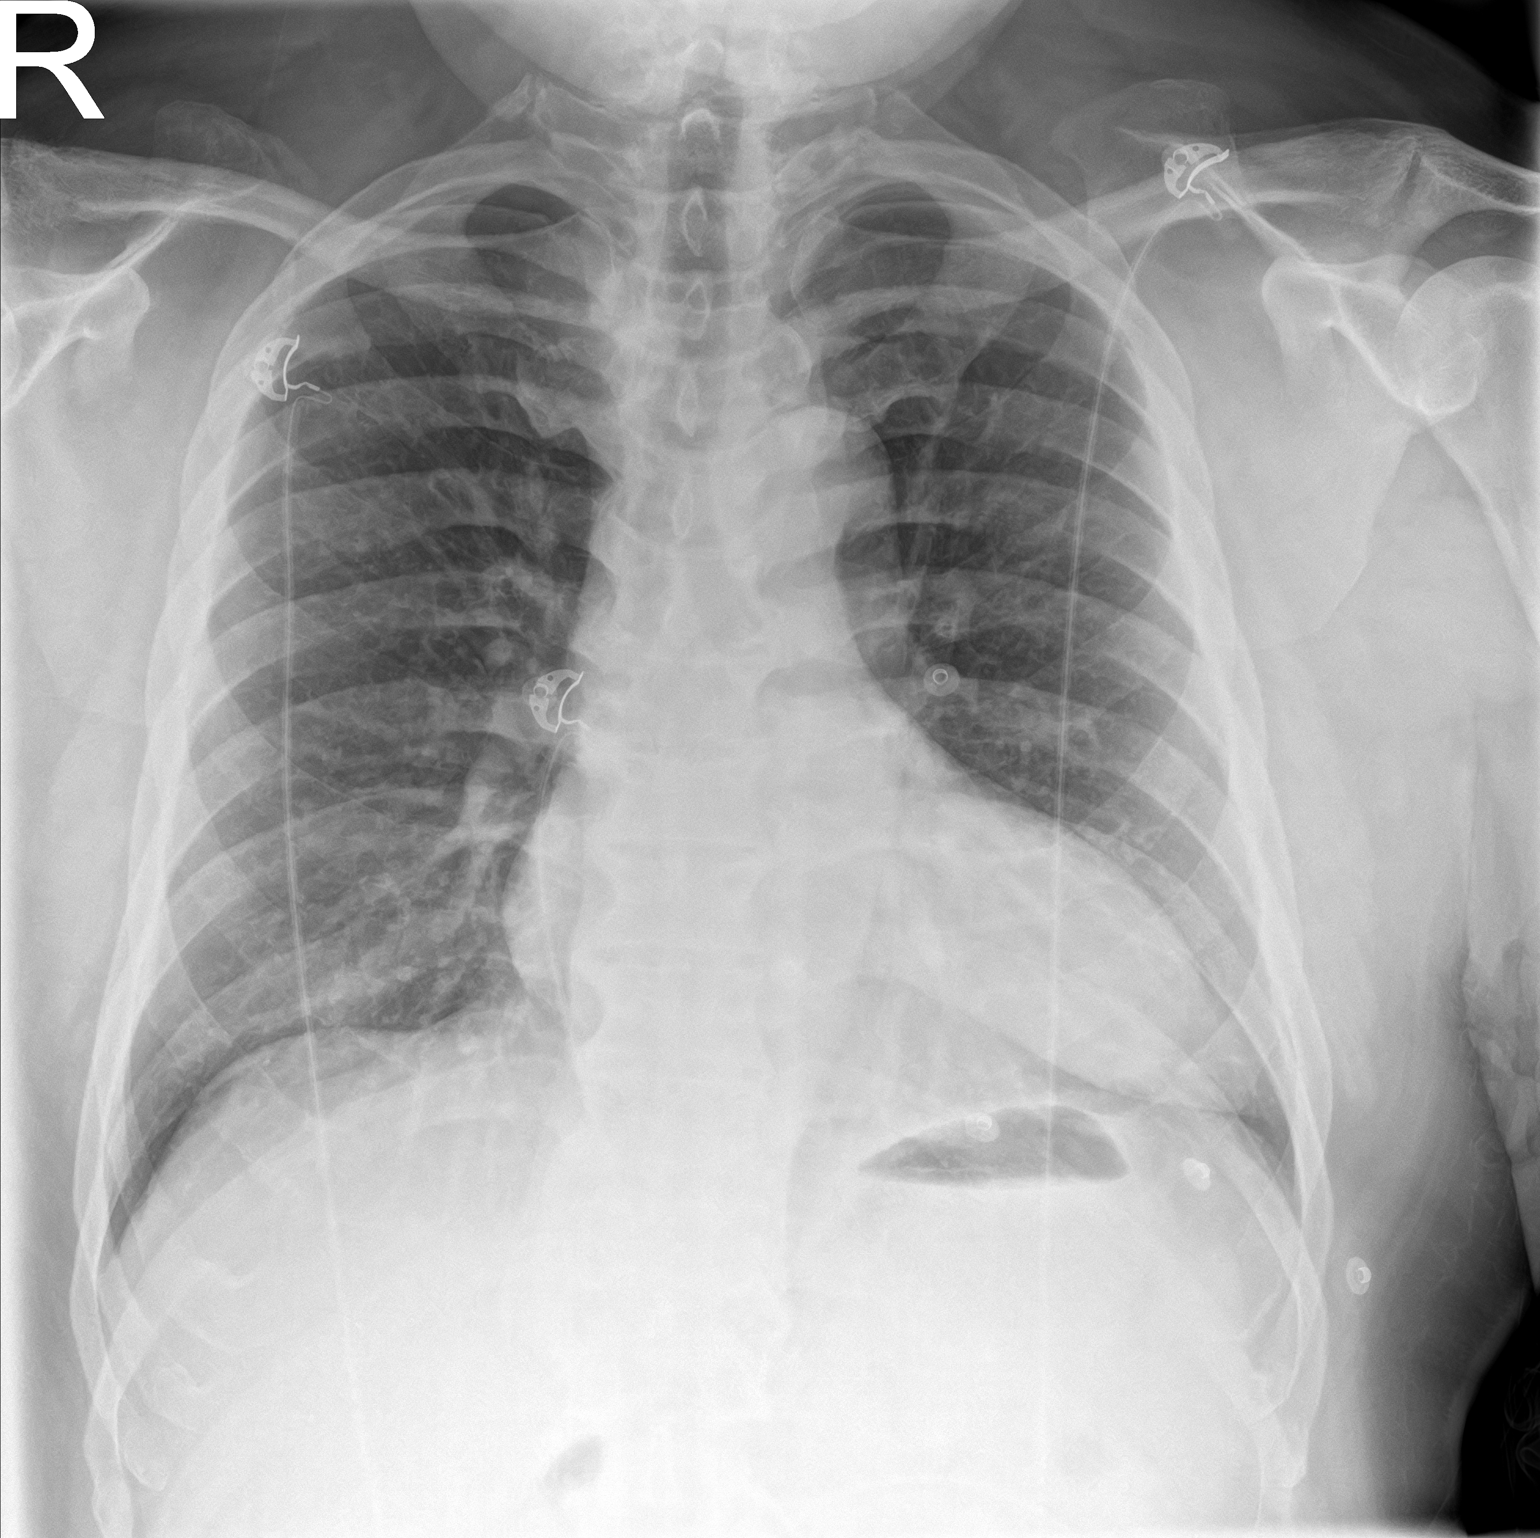
[im 2/2]
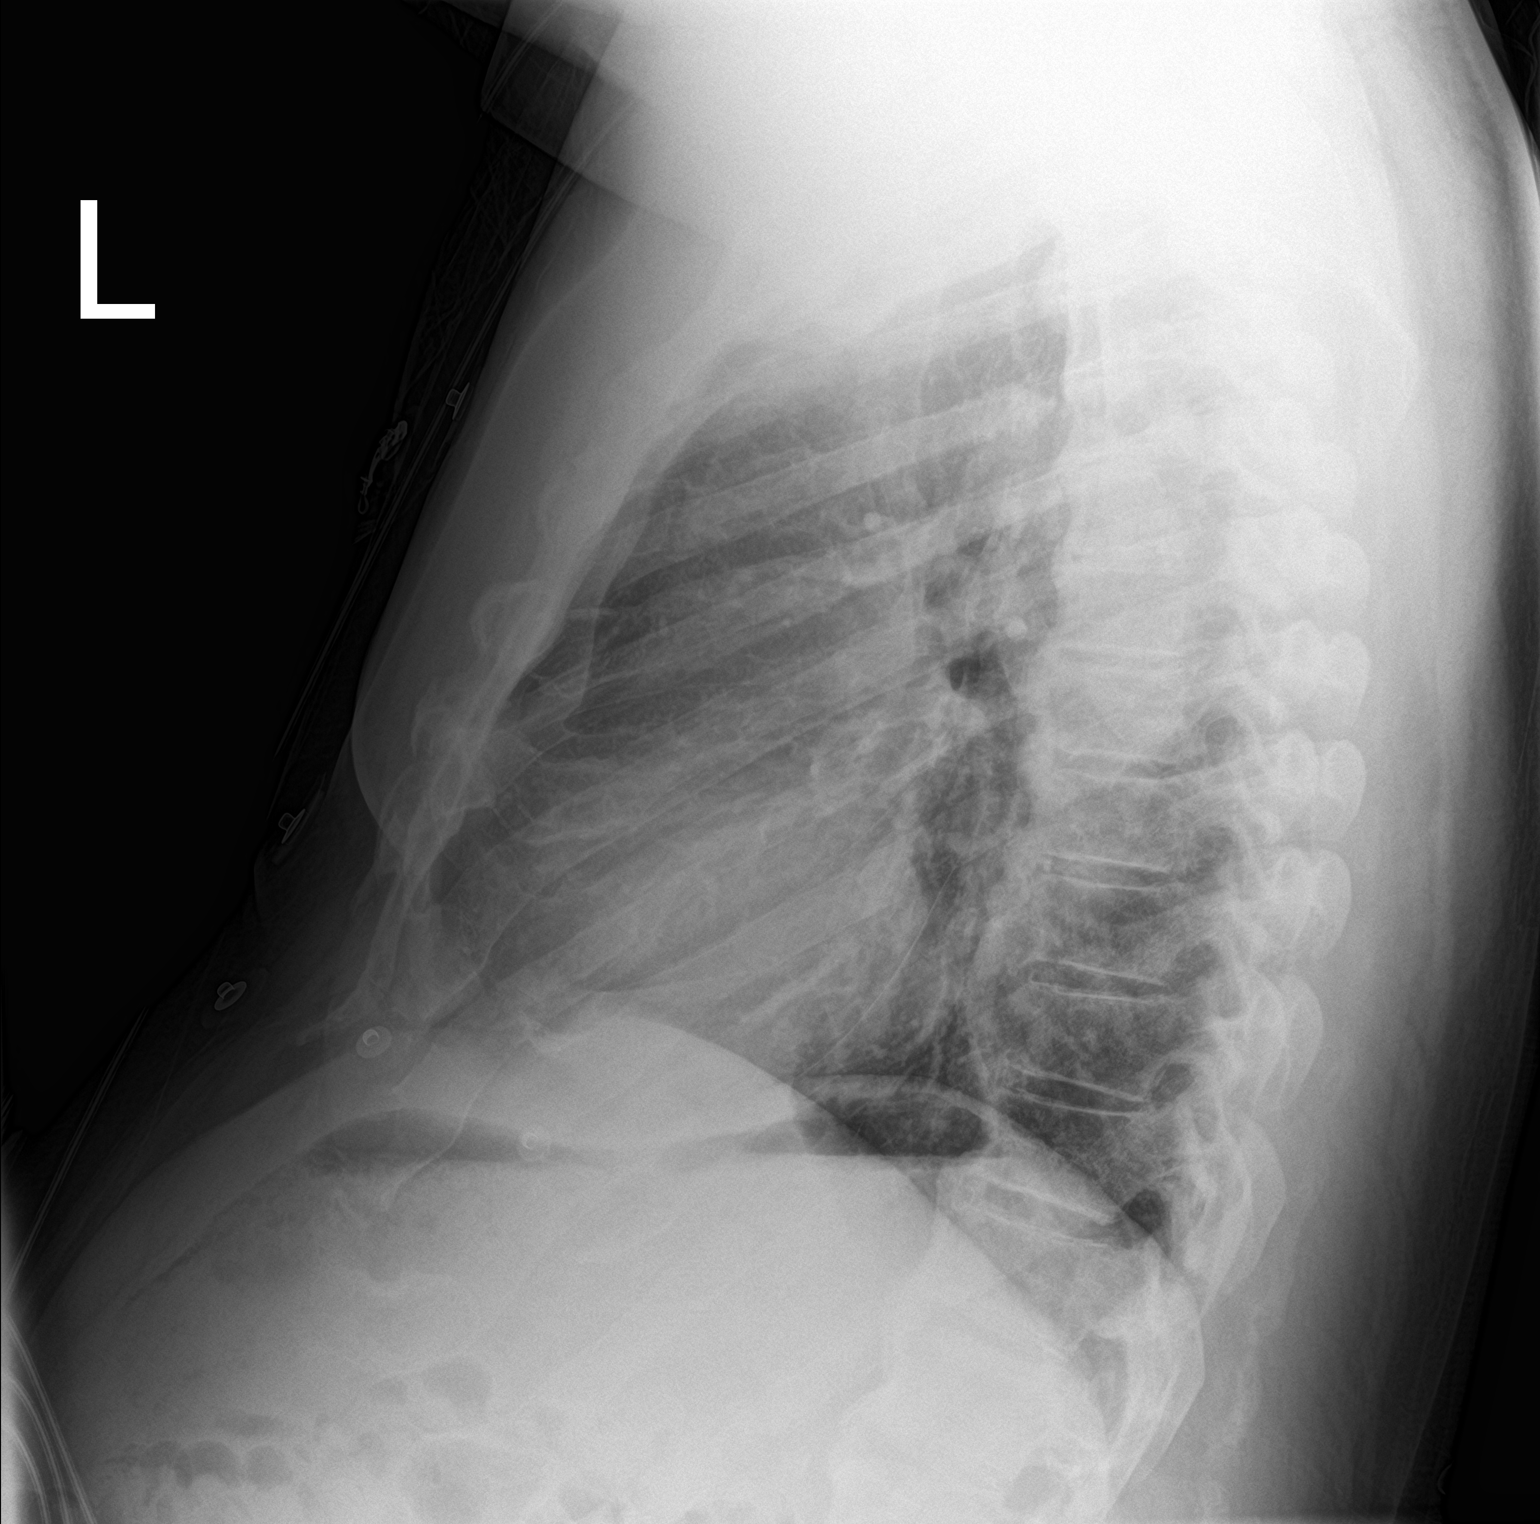

[2 of 2 positions shown; findings below may reference images not displayed]

FINDINGS: Cardiac shadow is enlarged but stable. The lungs are clear.
Degenerative changes of the thoracic spine are noted.
IMPRESSION: No active cardiopulmonary disease.

## 2022-12-29 ENCOUNTER — Other Ambulatory Visit: Payer: Self-pay | Admitting: Cardiovascular Disease

## 2022-12-30 NOTE — Telephone Encounter (Signed)
Prescription refill request for Xarelto received.  Indication:afib Last office visit:3/24 Weight:117.5  kg Age:62 Scr:1.07  11/23 CrCl:115.91  ml/min  Prescription refilled

## 2023-01-26 NOTE — Progress Notes (Signed)
Cardiology Office Note:  .   Date:  01/26/2023  ID:  Jeffrey Lewis, DOB Mar 18, 1961, MRN [id number] PCP: Harvest Forest, MD  [facility] Providers Cardiologist:  Kristeen Miss, MD    Patient Profile: .      PMH HFimpEF (heart failure with improved ejection fraction) Non-ischemic cardiomyopathy Echocardiogram 2019: EF 10-15 Echocardiogram 2020: EF 55-60 Coronary artery disease Mild to mod non-obs CAD by CCTA in 2020 CAC score 297 (86th percentile)  Low risk nuclear stress test, EF 46% 05/2019 Paroxysmal atrial fibrillation Hypertension Diabetes mellitus Obesity Hyperlipidemia Aortic root dilatation Mild aortic root dilatation at 40 mm on TTE 08/17/2020  He established care with cardiology when he was referred to our office for an echocardiogram and was told by the echo tech that his ejection fraction was markedly reduced.  He was seen by Dr. Elease Hashimoto 11/10/2017.  Echo ordered by PCP for several months of progressive dyspnea.  He also had 2 pillow orthopnea and PND.  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  Echo revealed EF around 15% and he was initiated on GDMT for acute heart failure.  History of hypertension for years.  Coronary CT angiogram performed November 05, 2018 revealed no significant coronary artery disease, CAC score of 297 (86 percentile).  He had new inferior Q waves on EKG 05/26/2019 and underwent Lexiscan stress testing.  This was a low risk test with no evidence of ischemia or infarction.  Was found to have atrial fib with RVR April 2022 with CHA2DS2-VASc score of 4 for which he was started on Xarelto for stroke prevention.  Echo had improved to showed normal LV function and he had started exercising.  Last cardiology clinic visit was 07/04/2022 with Dr. Elease Hashimoto.  He had stopped exercising due to busyness at work.  He was encouraged to resume exercise.  No medication changes were initiated and 59-month follow-up  was recommended.       History of Present Illness: .   Jeffrey Lewis is a  62 y.o. male   ROS:        Studies Reviewed: .         Risk Assessment/Calculations:    CHA2DS2-VASc Score = 4   This indicates a 4.8% annual risk of stroke. The patient's score is based upon: CHF History: 1 HTN History: 1 Diabetes History: 1 Stroke History: 0 Vascular Disease History: 1 Age Score: 0 Gender Score: 0         Physical Exam:   VS:  There were no vitals taken for this visit.   Wt Readings from Last 3 Encounters:  07/04/22 259 lb (117.5 kg)  04/07/22 260 lb (117.9 kg)  01/03/22 261 lb 3.2 oz (118.5 kg)    GEN: Well nourished, well developed in no acute distress NECK: No JVD; No carotid bruits CARDIAC: RRR, no murmurs, rubs, gallops RESPIRATORY:  Clear to auscultation without rales, wheezing or rhonchi  ABDOMEN: Soft, non-tender, non-distended EXTREMITIES:  No edema; No deformity     ASSESSMENT AND PLAN: .    NICM: History of HRrEF with EF as low as 15% in 2019. EF normalized on GDMT and most recent TTE revealed EF 55%, indeterminate diastolic parameters, normal RV, no significant valve disease.  PAF on chronic anticoagulation: Continue Xarelto for stroke prevention for CHA2DS2-VASc score of 4 CAD: Nonobstructive CAD on coronary CTA 12/2018 Hyperlipidemia: Started Repatha 01/2022 Aortic root dilatation: Mild dilatation of aortic root on  TTE 08/17/20.        Dispo:   Signed, Eligha Bridegroom, NP-CThis encounter was created in error - please disregard.

## 2023-01-27 ENCOUNTER — Telehealth: Payer: Self-pay | Admitting: Pharmacy Technician

## 2023-01-27 ENCOUNTER — Other Ambulatory Visit (HOSPITAL_COMMUNITY): Payer: Self-pay

## 2023-01-27 ENCOUNTER — Ambulatory Visit: Payer: BC Managed Care – PPO | Attending: Nurse Practitioner | Admitting: Nurse Practitioner

## 2023-01-27 NOTE — Telephone Encounter (Signed)
Pharmacy Patient Advocate Encounter   Received notification from CoverMyMeds that prior authorization for entresto is required/requested.   Insurance verification completed.   The patient is insured through [facility] .   Per test claim: PA required; PA submitted to [facility] via CoverMyMeds Key/confirmation #/[id number] Status is pending

## 2023-01-27 NOTE — Telephone Encounter (Signed)
Pharmacy Patient Advocate Encounter  Received notification from [facility] that Prior Authorization for entresto has been APPROVED from 01/27/23 to 01/26/24. Ran test claim, Copay is $90.00- 3 month supply. This test claim was processed through [facility]- copay amounts may vary at other pharmacies due to pharmacy/plan contracts, or as the patient moves through the different stages of their insurance plan.   PA #/Case ID/Reference #: [id number]

## 2023-01-28 ENCOUNTER — Encounter: Payer: Self-pay | Admitting: Nurse Practitioner

## 2023-01-29 ENCOUNTER — Telehealth: Payer: Self-pay | Admitting: Pharmacy Technician

## 2023-01-29 NOTE — Telephone Encounter (Signed)
Pharmacy Patient Advocate Encounter   Received notification from Fax that prior authorization for repatha is required/requested.   Insurance verification completed.   The patient is insured through [facility] .   Per test claim: PA required; PA submitted to [facility] via CoverMyMeds Key/confirmation #/[id number] FAXED- 01/29/23 Status is pending

## 2023-01-30 NOTE — Telephone Encounter (Signed)
Pharmacy Patient Advocate Encounter  Received notification from [facility] that Prior Authorization for repatha has been APPROVED from 01/29/23 to 01/29/24   PA #/Case ID/Reference #: [id number]

## 2023-02-12 ENCOUNTER — Telehealth: Payer: Self-pay

## 2023-02-12 ENCOUNTER — Telehealth: Payer: Self-pay | Admitting: *Deleted

## 2023-02-12 NOTE — Telephone Encounter (Signed)
Pt has been scheduled for tele pre op appt 02/25/23. Med rec and consent are done.     Patient Consent for Virtual Visit        Jeffrey Lewis has provided verbal consent on 02/12/2023 for a virtual visit (video or telephone).   CONSENT FOR VIRTUAL VISIT FOR:  Jeffrey Lewis  By participating in this virtual visit I agree to the following:  I hereby voluntarily request, consent and authorize [facility] and its employed or contracted physicians, physician assistants, nurse practitioners or other licensed health care professionals (the Practitioner), to provide me with telemedicine health care services (the "Services") as deemed necessary by the treating Practitioner. I acknowledge and consent to receive the Services by the Practitioner via telemedicine. I understand that the telemedicine visit will involve communicating with the Practitioner through live audiovisual communication technology and the disclosure of certain medical information by electronic transmission. I acknowledge that I have been given the opportunity to request an in-person assessment or other available alternative prior to the telemedicine visit and am voluntarily participating in the telemedicine visit.  I understand that I have the right to withhold or withdraw my consent to the use of telemedicine in the course of my care at any time, without affecting my right to future care or treatment, and that the Practitioner or I may terminate the telemedicine visit at any time. I understand that I have the right to inspect all information obtained and/or recorded in the course of the telemedicine visit and may receive copies of available information for a reasonable fee.  I understand that some of the potential risks of receiving the Services via telemedicine include:  Delay or interruption in medical evaluation due to technological equipment failure or disruption; Information transmitted may not be sufficient (e.g. poor  resolution of images) to allow for appropriate medical decision making by the Practitioner; and/or  In rare instances, security protocols could fail, causing a breach of personal health information.  Furthermore, I acknowledge that it is my responsibility to provide information about my medical history, conditions and care that is complete and accurate to the best of my ability. I acknowledge that Practitioner's advice, recommendations, and/or decision may be based on factors not within their control, such as incomplete or inaccurate data provided by me or distortions of diagnostic images or specimens that may result from electronic transmissions. I understand that the practice of medicine is not an exact science and that Practitioner makes no warranties or guarantees regarding treatment outcomes. I acknowledge that a copy of this consent can be made available to me via my patient portal [facility] MyChart), or I can request a printed copy by calling the office of [facility].    I understand that my insurance will be billed for this visit.   I have read or had this consent read to me. I understand the contents of this consent, which adequately explains the benefits and risks of the Services being provided via telemedicine.  I have been provided ample opportunity to ask questions regarding this consent and the Services and have had my questions answered to my satisfaction. I give my informed consent for the services to be provided through the use of telemedicine in my medical care

## 2023-02-12 NOTE — Telephone Encounter (Signed)
   Name: Jeffrey Lewis  DOB: [birth year]  MRN: [id number]  Primary Cardiologist: Kristeen Miss, MD  Chart reviewed as part of pre-operative protocol coverage. Because of Jeffrey Lewis past medical history and time since last visit, he will require a follow-up telephone visit in order to better assess preoperative cardiovascular risk.  Pre-op covering staff: - Please schedule appointment and call patient to inform them. If patient already had an upcoming appointment within acceptable timeframe, please add "pre-op clearance" to the appointment notes so provider is aware. - Please contact requesting surgeon's office via preferred method (i.e, phone, fax) to inform them of need for appointment prior to surgery.  Per office protocol, patient can hold Xarelto for 1-2 days prior to procedure.   Sharlene Dory, PA-C  02/12/2023, 12:29 PM

## 2023-02-12 NOTE — Telephone Encounter (Signed)
..     Pre-operative Risk Assessment    Patient Name: Jeffrey Lewis  DOB: 01/28/1961 MRN: [id number]      Request for Surgical Clearance    Procedure:   COLONOSCOPY  Date of Surgery:  Clearance 03/04/23                                 Surgeon:  DR Kathi Der Surgeon's Group or Practice Name:  [facility] Phone number:  [phone] Fax number:  [phone]   Type of Clearance Requested:   - Medical  - Pharmacy:  Hold Rivaroxaban (Xarelto)     Type of Anesthesia:   PROPOFOL   Additional requests/questions:   LAST O/V 07/04/22, MISSED 6 MTH APPT  Signed, Renee Ramus   02/12/2023, 10:39 AM

## 2023-02-12 NOTE — Telephone Encounter (Signed)
Patient with diagnosis of afib on Xarelto for anticoagulation.    Procedure: colonoscopy Date of procedure: 03/04/23  CHA2DS2-VASc Score = 4  This indicates a 4.8% annual risk of stroke. The patient's score is based upon: CHF History: 1 HTN History: 1 Diabetes History: 1 Stroke History: 0 Vascular Disease History: 1 Age Score: 0 Gender Score: 0   CrCl 13mL/min using adjusted body weight Platelet count 191K  Per office protocol, patient can hold Xarelto for 1-2 days prior to procedure.    **This guidance is not considered finalized until pre-operative APP has relayed final recommendations.**

## 2023-02-12 NOTE — Telephone Encounter (Signed)
Pt has been scheduled for tele pre op appt 02/25/23. Med rec and consent are done.

## 2023-02-25 ENCOUNTER — Ambulatory Visit: Payer: BC Managed Care – PPO | Attending: Nurse Practitioner

## 2023-02-25 DIAGNOSIS — Z01810 Encounter for preprocedural cardiovascular examination: Secondary | ICD-10-CM

## 2023-02-25 NOTE — Progress Notes (Signed)
Virtual Visit via Telephone Note   Because of TAIYO KUDER co-morbid illnesses, he is at least at moderate risk for complications without adequate follow up.  This format is felt to be most appropriate for this patient at this time.  The patient did not have access to video technology/had technical difficulties with video requiring transitioning to audio format only (telephone).  All issues noted in this document were discussed and addressed.  No physical exam could be performed with this format.  Please refer to the patient's chart for his consent to telehealth for [facility].  Evaluation Performed:  Preoperative cardiovascular risk assessment _____________   Date:  02/25/2023   Patient ID:  Jeffrey Lewis, DOB April 24, 1961, MRN [id number] Patient Location:  Home Provider location:   Office  Primary Care Provider:  Harvest Forest, MD Primary Cardiologist:  Kristeen Miss, MD  Chief Complaint / Patient Profile   62 y.o. y/o male with a h/o HTN, obesity, HLD HFrEF NICM paroxysmal AF who is pending colonoscopy and presents today for telephonic preoperative cardiovascular risk assessment.  History of Present Illness    Jeffrey Lewis is a 62 y.o. male who presents via audio/video conferencing for a telehealth visit today.  Pt was last seen in cardiology clinic on 07/04/2022 by Dr. Elease Hashimoto.  At that time Jeffrey Lewis was doing well and reported no recurrent episodes of atrial fibrillation.  He was advised to increase physical activity as tolerated. The patient is now pending procedure as outlined above. Since his last visit, he has been doing well with no new cardiac complaints or recurrence of atrial fibrillation.  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  He denies chest pain, shortness of breath, lower extremity edema, fatigue, palpitations, melena, hematuria, hemoptysis, diaphoresis, weakness, presyncope,  syncope, orthopnea, and PND.    Past Medical History    Past Medical History:  Diagnosis Date   CAD (coronary artery disease) 05/25/2019   Cor CTA 10/2018:  RCA 0-25; LM 25-49; pLLAD 50-69; pLCx 25-49 / Ca score 297 / FFR in LM, LCx, LAD, RCA normal >> Med Rx   Diabetes mellitus    Essential hypertension 05/25/2019   HFrEF and return of normal LVF 05/25/2019   Echocardiogram 7/19: EF 10-15 // Echocardiogram 06/2018: EF 55-60 // Non-ischemic cardiomyopathy    Mixed hyperlipidemia 05/25/2019   Nonischemic cardiomyopathy 05/25/2019   Non-obs CAD by coronary CTA in 10/2018    Past Surgical History:  Procedure Laterality Date   COLONOSCOPY WITH PROPOFOL N/A 01/07/2016   Procedure: COLONOSCOPY WITH PROPOFOL;  Surgeon: Charolett Bumpers, MD;  Location: [facility];  Service: Endoscopy;  Laterality: N/A;   NO PAST SURGERIES      Allergies  No Known Allergies  Home Medications    Prior to Admission medications   Medication Sig Start Date End Date Taking? Authorizing Provider  empagliflozin (JARDIANCE) 25 MG TABS tablet Take 1 tablet by mouth daily.    [provider]  Evolocumab (REPATHA SURECLICK) 140 MG/ML SOAJ Inject 140 mg into the skin every 14 (fourteen) days. 06/27/22   Nahser, Deloris Ping, MD  ezetimibe (ZETIA) 10 MG tablet Take 1 tablet by mouth daily. 05/02/21   [provider]  metFORMIN (GLUCOPHAGE-XR) 500 MG 24 hr tablet Take 500 mg by mouth 2 (two) times daily. 10/06/18   [provider]  metoprolol tartrate (LOPRESSOR) 100 MG tablet TAKE 1 TABLET BY MOUTH TWICE  A DAY 08/29/22   Nahser, Deloris Ping, MD  Multiple Vitamins-Minerals (CENTRUM SILVER 50+MEN PO) Take 1 tablet by mouth daily.    [provider]  rosuvastatin (CRESTOR) 40 MG tablet Take 1 tablet by mouth daily.    [provider]  sacubitril-valsartan (ENTRESTO) 49-51 MG Take 1 tablet by mouth 2 (two) times daily. 02/25/22   Nahser, Deloris Ping, MD  spironolactone (ALDACTONE) 25 MG tablet  daily.    [provider]  XARELTO 20 MG TABS tablet TAKE 1 TABLET BY MOUTH EVERY DAY 12/30/22   Nahser, Deloris Ping, MD    Physical Exam    Vital Signs:  Jeffrey Lewis does not have vital signs available for review today.  Given telephonic nature of communication, physical exam is limited. AAOx3. NAD. Normal affect.  Speech and respirations are unlabored.  Accessory Clinical Findings    None  Assessment & Plan    1.  Preoperative Cardiovascular Risk Assessment: -Patient's RCRI score 6.6%  The patient affirms he has been doing well without any new cardiac symptoms. They are able to achieve 7 METS without cardiac limitations. Therefore, based on ACC/AHA guidelines, the patient would be at acceptable risk for the planned procedure without further cardiovascular testing. The patient was advised that if he develops new symptoms prior to surgery to contact our office to arrange for a follow-up visit, and he verbalized understanding.   The patient was advised that if he develops new symptoms prior to surgery to contact our office to arrange for a follow-up visit, and he verbalized understanding.  Per office protocol, patient can hold Xarelto for 1-2 days prior to procedure.   A copy of this note will be routed to requesting surgeon.  Time:   Today, I have spent 5 minutes with the patient with telehealth technology discussing medical history, symptoms, and management plan.     Napoleon Form, Leodis Rains, NP  02/25/2023, 7:25 AM

## 2023-03-25 ENCOUNTER — Other Ambulatory Visit: Payer: Self-pay | Admitting: Cardiovascular Disease

## 2023-03-25 DIAGNOSIS — I5020 Unspecified systolic (congestive) heart failure: Secondary | ICD-10-CM

## 2023-03-25 DIAGNOSIS — I428 Other cardiomyopathies: Secondary | ICD-10-CM

## 2023-05-12 LAB — LAB REPORT - SCANNED
Albumin, Urine POC: 1.2
Creatinine, POC: 73 mg/dL
Microalb Creat Ratio: 16

## 2023-05-19 ENCOUNTER — Other Ambulatory Visit: Payer: Self-pay | Admitting: Cardiovascular Disease

## 2023-05-19 DIAGNOSIS — I5020 Unspecified systolic (congestive) heart failure: Secondary | ICD-10-CM

## 2023-05-19 DIAGNOSIS — I428 Other cardiomyopathies: Secondary | ICD-10-CM

## 2023-06-20 ENCOUNTER — Other Ambulatory Visit: Payer: Self-pay | Admitting: Cardiovascular Disease

## 2023-06-20 DIAGNOSIS — I5020 Unspecified systolic (congestive) heart failure: Secondary | ICD-10-CM

## 2023-06-20 DIAGNOSIS — I428 Other cardiomyopathies: Secondary | ICD-10-CM

## 2023-09-07 LAB — LAB REPORT - SCANNED
A1c: 7.4
EGFR: 68

## 2023-10-01 ENCOUNTER — Ambulatory Visit: Attending: Cardiovascular Disease | Admitting: Cardiovascular Disease

## 2023-10-01 ENCOUNTER — Encounter: Payer: Self-pay | Admitting: Cardiovascular Disease

## 2023-10-01 ENCOUNTER — Other Ambulatory Visit: Payer: Self-pay | Admitting: Cardiovascular Disease

## 2023-10-01 ENCOUNTER — Telehealth: Payer: Self-pay | Admitting: Cardiovascular Disease

## 2023-10-01 VITALS — BP 110/76 | HR 76 | Ht 71.0 in | Wt 262.0 lb

## 2023-10-01 DIAGNOSIS — I10 Essential (primary) hypertension: Secondary | ICD-10-CM | POA: Diagnosis not present

## 2023-10-01 DIAGNOSIS — E782 Mixed hyperlipidemia: Secondary | ICD-10-CM | POA: Diagnosis not present

## 2023-10-01 DIAGNOSIS — I428 Other cardiomyopathies: Secondary | ICD-10-CM

## 2023-10-01 DIAGNOSIS — I2510 Atherosclerotic heart disease of native coronary artery without angina pectoris: Secondary | ICD-10-CM | POA: Diagnosis not present

## 2023-10-01 MED ORDER — METOPROLOL TARTRATE 100 MG PO TABS: 100.0000 mg | ORAL_TABLET | Freq: Two times a day (BID) | ORAL | 0 refills | Status: DC

## 2023-10-01 NOTE — Telephone Encounter (Signed)
*  STAT* If patient is at the pharmacy, call can be transferred to refill team.   1. Which medications need to be refilled? (please list name of each medication and dose if known) metoprolol  tartrate (LOPRESSOR ) 100 MG tablet    2. Would you like to learn more about the convenience, safety, & potential cost savings by using the [facility]?    3. Are you open to using the [facility] (Type [facility]. ).   4. Which pharmacy/location (including street and city if local pharmacy) is medication to be sent to? [id number] [place] [place] - [address]    5. Do they need a 30 day or 90 day supply? 90 day

## 2023-10-01 NOTE — Telephone Encounter (Signed)
 Pt's medication was sent to pt's pharmacy as requested. Confirmation received.

## 2023-10-01 NOTE — Progress Notes (Signed)
 Cardiology Office Note:    Date:  10/01/2023   ID:  Jeffrey Lewis, DOB February 20, 1961, MRN [id number]  PCP:  Nohemi Batters, MD  Cardiologist:  Ahmad Alert, MD 259 lbs    Referring MD: Nohemi Batters, MD   Chief Complaint  Patient presents with   coronary artery calcification   Atrial Fibrillation        Congestive Heart Failure              Jeffrey Lewis is a 63 y.o. male with a hx of hypertension, obesity, hyperlipidemia.  He had episodes of shortness of breath that seem to be worsening.  He was scheduled for an echocardiogram.  The echo tech noticed that his ejection fraction was markedly reduced and he was worked into my schedule for further evaluation.  Several months of progressive dyspnea.     Worse several weeks ago  Developed a cough.  Saw [name] who ordered an echo  Has ocasional 2 pillow orthopnea.     Has PND  Significant DOE . 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  On meds for several years.  Still eats some salty foods.   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  He does not know if he has apneic episodes. Does not have early morning somnolence.  Is have some sleepiness associated with some narcotic cough syrup that he received recently.  Was just started on Furosemide   A day  - doesn't seem to be helping much   November 24, 2017: Mr. 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  He has been on torsemide .  We started Entresto  during his last office visit.  Does not eat much salty foods.   Leg swelling has resolved.   Been found to have an elevated PSA level.  There is a chance that he will need a  prostate biopsy.  Wt today = 266 Wt on JUly 16 was 273 lbs.   December 22, 2017:  Jeffrey Lewis seen today for follow-up of his acute on chronic combined systolic and diastolic congestive heart failure. His weight today is 268 pounds.  He is tolerating Entresto  fairly well.  We have started him on low-dose carvedilol . Feeling better and better.   January 29, 2018:  Jeffrey Lewis is seen today for follow-up of his chronic combined congestive heart failure.  He is tolerating Entresto  at the low dose fairly well. Weight today is 274 pounds which is up 8 pounds from his last visit in August. No swelling  Breathing is better.     May 03, 2018: Jeffrey Lewis seen back today for a follow-up of his chronic systolic congestive heart failure. Today is 273 pounds which is 1 pound less than it was last visit. Is on Entresto  49-51 mg twice a day and seems to be tolerating it quite well. Has developed a rash behind his left calf.  Jan. 28, 2021  Doing well.  Breathing has improved. On entresto .   Now off the torsemide   Has some tingling in his arm.   intermittant No exertional cp or dyspnea.  Coronary CT angiogram performed November 05, 2018 reveals no significant coronary artery disease. FFR analysis also confirmed the absence of significant coronary artery disease.  August 24, 2020: Jeffrey Lewis is seen today for follow up of his CHF Has had a Coronary CTA that showed mild CAD  He had inferior Q waves - lexiscan  myoview  did not show any ischemia or previous infarction Had palpitations several weeks ago  Was found to have atrial fib with RVR  CHADS2VASC  =  4  ( CHF,CAD, HTN, DM )  Metoprolol  was increase, xarelto  started  Had a stressful day at work .  Echo from August 17, 2020 shows normal LV function  Has been exercising  Getting an ellipitical   Jan. 9, 2023 Jeffrey Lewis is seen for follow up of his CHF, obesity.  History of atrial fibrillation diagnosed at [facility].  He converted to sinus rhythm on his own.  He  has been on Xarelto . 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  On xarelto   Wt is 259 lbs  No cp, no dyspnea No palpitations to suggest recurrent atrial fib   Needs to restart his exercise    October 01, 2023 Jeffrey Lewis is seen for follow up of his CHF, obesity , hx of atrial fib  Not much regular exercise  Still working - is a Press photographer   Lipids are followed by his medical doctor  LDL are < 10 .     Past Medical History:  Diagnosis Date   CAD (coronary artery disease) 05/25/2019   Cor CTA 10/2018:  RCA 0-25; LM 25-49; pLLAD 50-69; pLCx 25-49 / Ca score 297 / FFR in LM, LCx, LAD, RCA normal >> Med Rx   Diabetes mellitus    Essential hypertension 05/25/2019   HFrEF and return of normal LVF 05/25/2019   Echocardiogram 7/19: EF 10-15 // Echocardiogram 06/2018: EF 55-60 // Non-ischemic cardiomyopathy    Mixed hyperlipidemia 05/25/2019   Nonischemic cardiomyopathy 05/25/2019   Non-obs CAD by coronary CTA in 10/2018     Past Surgical History:  Procedure Laterality Date   COLONOSCOPY WITH PROPOFOL  N/A 01/07/2016   Procedure: COLONOSCOPY WITH PROPOFOL ;  Surgeon: Garrett Kallman, MD;  Location: [facility];  Service: Endoscopy;  Laterality: N/A;   NO PAST SURGERIES      Current Medications: Current Meds  Medication Sig   empagliflozin (JARDIANCE) 25 MG TABS tablet Take 1 tablet by mouth daily.   Evolocumab  (REPATHA  SURECLICK) 140 MG/ML SOAJ Inject 140 mg into the skin every 14 (fourteen) days.   ezetimibe (ZETIA) 10 MG tablet Take 1 tablet by mouth daily.   metoprolol  tartrate (LOPRESSOR ) 100 MG tablet Take 1 tablet (100 mg total) by mouth 2 (two) times daily.   Multiple Vitamins-Minerals (CENTRUM SILVER 50+MEN PO) Take 1 tablet by mouth daily.   rosuvastatin  (CRESTOR ) 40  MG tablet Take 1 tablet by mouth daily.   sacubitril -valsartan  (ENTRESTO ) 49-51 MG TAKE 1 TABLET BY MOUTH TWICE A DAY   spironolactone  (ALDACTONE ) 25 MG tablet daily.   XARELTO  20 MG TABS tablet TAKE 1 TABLET BY MOUTH EVERY DAY     Allergies:   Patient has no known allergies.   Social History   Socioeconomic History  Marital status: Married    Spouse name: Not on file   Number of children: Not on file   Years of education: Not on file   Highest education level: Not on file  Occupational History   Not on file  Tobacco Use   Smoking status: Never   Smokeless tobacco: Never  Vaping Use   Vaping status: Never Used  Substance and Sexual Activity   Alcohol use: Yes    Comment: 1 beer per day   Drug use: No   Sexual activity: Yes  Other Topics Concern   Not on file  Social History Narrative   Not on file   Social Drivers of Health   Financial Resource Strain: Not on file  Food Insecurity: Not on file  Transportation Needs: Not on file  Physical Activity: Not on file  Stress: Not on file  Social Connections: Not on file     Family History: The patient's family history includes CAD in his brother; Hypertension in his father and mother.  ROS:   Please see the history of present illness.     All other systems reviewed and are negative.  EKGs/Labs/Other Studies Reviewed:    The following studies were reviewed today:    Recent Labs: No results found for requested labs within last 365 days.  Recent Lipid Panel    Component Value Date/Time   CHOL 181 11/07/2019 1047   TRIG 99 11/07/2019 1047   HDL 55 11/07/2019 1047   CHOLHDL 3.3 11/07/2019 1047   LDLCALC 108 (H) 11/07/2019 1047    Physical Exam: Blood pressure 110/76, pulse 76, height 5\' 11"  (1.803 m), weight 262 lb (118.8 kg), SpO2 98%.       GEN:  moderately obese middle age male  in no acute distress HEENT: Normal NECK: No JVD; No carotid bruits LYMPHATICS: No lymphadenopathy CARDIAC: RRR , no  murmurs, rubs, gallops RESPIRATORY:  Clear to auscultation without rales, wheezing or rhonchi  ABDOMEN: Soft, non-tender, non-distended MUSCULOSKELETAL:  No edema; No deformity  SKIN: Warm and dry NEUROLOGIC:  Alert and oriented x 3    EKG:        ASSESSMENT:    1. Essential hypertension   2. Mixed hyperlipidemia   3. Coronary artery disease involving native coronary artery of native heart without angina pectoris   4. NICM (nonischemic cardiomyopathy) (HCC)      PLAN:      1.  Acute on chronic combined CHF:      stable,  last echo 08/17/20 showed normal LVEF, indeterminite diastolic function, Mildly dilated aortic root at 40 mm      2.  New onset atrial fibrillation:  He has not had any recurrent atrial fibrillation.  Continue current dose of metoprolol .  Continue Xarelto  He is CHA2DS2-VASc score is 4..      2.  Obesity:        3.  Diabetes mellitus:        Medication Adjustments/Labs and Tests Ordered: Current medicines are reviewed at length with the patient today.  Concerns regarding medicines are outlined above.  Orders Placed This Encounter  Procedures   EKG 12-Lead   No orders of the defined types were placed in this encounter.    Patient Instructions  Medication Instructions:  Your physician recommends that you continue on your current medications as directed. Please refer to the Current Medication list given to you today.  *If you need a refill on your cardiac medications before your next appointment, please call  your pharmacy*  Lab Work: None ordered.  If you have labs (blood work) drawn today and your tests are completely normal, you will receive your results only by: MyChart Message (if you have MyChart) OR A paper copy in the mail If you have any lab test that is abnormal or we need to change your treatment, we will call you to review the results.  Testing/Procedures: None ordered.   Follow-Up: At [facility], you and your  health needs are our priority.  As part of our continuing mission to provide you with exceptional heart care, our providers are all part of one team.  This team includes your primary Cardiologist (physician) and Advanced Practice Providers or APPs (Physician Assistants and Nurse Practitioners) who all work together to provide you with the care you need, when you need it.  Your next appointment:   12 months       Signed, Ahmad Alert, MD  10/01/2023 5:59 PM    [facility]

## 2023-10-01 NOTE — Patient Instructions (Signed)
 Medication Instructions:  Your physician recommends that you continue on your current medications as directed. Please refer to the Current Medication list given to you today.  *If you need a refill on your cardiac medications before your next appointment, please call your pharmacy*  Lab Work: None ordered.  If you have labs (blood work) drawn today and your tests are completely normal, you will receive your results only by: MyChart Message (if you have MyChart) OR A paper copy in the mail If you have any lab test that is abnormal or we need to change your treatment, we will call you to review the results.  Testing/Procedures: None ordered.   Follow-Up: At [facility], you and your health needs are our priority.  As part of our continuing mission to provide you with exceptional heart care, our providers are all part of one team.  This team includes your primary Cardiologist (physician) and Advanced Practice Providers or APPs (Physician Assistants and Nurse Practitioners) who all work together to provide you with the care you need, when you need it.  Your next appointment:   12 months

## 2023-10-20 ENCOUNTER — Other Ambulatory Visit: Payer: Self-pay | Admitting: Cardiovascular Disease

## 2024-05-11 ENCOUNTER — Telehealth: Payer: Self-pay | Admitting: Cardiovascular Disease

## 2024-05-11 DIAGNOSIS — I2510 Atherosclerotic heart disease of native coronary artery without angina pectoris: Secondary | ICD-10-CM

## 2024-05-11 DIAGNOSIS — E782 Mixed hyperlipidemia: Secondary | ICD-10-CM

## 2024-05-11 NOTE — Telephone Encounter (Signed)
" °*  STAT* If patient is at the pharmacy, call can be transferred to refill team.   1. Which medications need to be refilled? (please list name of each medication and dose if known)   Evolocumab  (REPATHA  SURECLICK) 140 MG/ML SOAJ    2. Which pharmacy/location (including street and city if local pharmacy) is medication to be sent to?  [id number] - [place], [place] - [address]    3. Do they need a 30 day or 90 day supply? 90  "

## 2024-05-12 NOTE — Telephone Encounter (Signed)
 Refill Request.

## 2024-05-13 MED ORDER — REPATHA SURECLICK 140 MG/ML ~~LOC~~ SOAJ: 1.0000 mL | SUBCUTANEOUS | 1 refills | Status: DC

## 2024-05-17 NOTE — Telephone Encounter (Signed)
" °*  STAT* If patient is at the pharmacy, call can be transferred to refill team.   1. Which medications need to be refilled? (please list name of each medication and dose if known)   Evolocumab  (REPATHA  SURECLICK) 140 MG/ML SOAJ   2. Would you like to learn more about the convenience, safety, & potential cost savings by using the [facility]?   3. Are you open to using the [facility] (Type [facility]. ).  4. Which pharmacy/location (including street and city if local pharmacy) is medication to be sent to?  [facility] - [place], [place] - [address]   5. Do they need a 30 day or 90 day supply?   30 day  Patient stated he has been out of this medication for 2 months.  Patient stated [facility] told him they did not have his medication and he wants this prescription sent to [facility] - [place], [place] - [address]  Patient has appointment scheduled with Dr. Floretta on 6/15. "

## 2024-05-20 ENCOUNTER — Other Ambulatory Visit (HOSPITAL_COMMUNITY): Payer: Self-pay

## 2024-05-20 ENCOUNTER — Telehealth: Payer: Self-pay | Admitting: Pharmacy Technician

## 2024-05-20 NOTE — Telephone Encounter (Signed)
 Pharmacy Patient Advocate Encounter  Received notification from [employer] that Prior Authorization for REPATHA  has been APPROVED from 05/20/24 to 04/27/25

## 2024-05-20 NOTE — Telephone Encounter (Signed)
 Answered clinical questions over the phone too

## 2024-05-20 NOTE — Telephone Encounter (Signed)
 Pharmacy Patient Advocate Encounter   Received notification from [facility] that prior authorization for repatha  is required/requested.   Insurance verification completed.   The patient is insured through [employer].   Per test claim: PA required; PA submitted to above mentioned insurance via Fax Key/confirmation #/EOC [id number] Status is pending      Faxed to [employer]

## 2024-06-01 ENCOUNTER — Telehealth: Payer: Self-pay | Admitting: Student in an Organized Health Care Education/Training Program

## 2024-06-01 DIAGNOSIS — E782 Mixed hyperlipidemia: Secondary | ICD-10-CM

## 2024-06-01 DIAGNOSIS — I2510 Atherosclerotic heart disease of native coronary artery without angina pectoris: Secondary | ICD-10-CM

## 2024-06-01 MED ORDER — REPATHA SURECLICK 140 MG/ML ~~LOC~~ SOAJ: 1.0000 mL | SUBCUTANEOUS | 4 refills | Status: AC

## 2024-06-01 MED ORDER — REPATHA SURECLICK 140 MG/ML ~~LOC~~ SOAJ: 1.0000 mL | SUBCUTANEOUS | 4 refills | Status: DC

## 2024-06-01 NOTE — Addendum Note (Signed)
 Addended by: Mialani Reicks K on: 06/01/2024 11:01 AM   Modules accepted: Orders

## 2024-06-01 NOTE — Telephone Encounter (Signed)
" °*  STAT* If patient is at the pharmacy, call can be transferred to refill team.   1. Which medications need to be refilled? (please list name of each medication and dose if known)   Evolocumab  (REPATHA  SURECLICK) 140 MG/ML SOAJ   2. Would you like to learn more about the convenience, safety, & potential cost savings by using the [facility]?   3. Are you open to using the [facility] ([facility]. ).  4. Which pharmacy/location (including street and city if local pharmacy) is medication to be sent to?  [facility] - [place], [place] - [address]   5. Do they need a 30 day or 90 day supply?   90 day  Caller [place]) stated patient is completely out of this medication.   Patient has appointment scheduled with Dr. Floretta on 6/15. "

## 2024-06-01 NOTE — Telephone Encounter (Signed)
" °*  STAT* If patient is at the pharmacy, call can be transferred to refill team.   1. Which medications need to be refilled? (please list name of each medication and dose if known) Evolocumab  (REPATHA  SURECLICK) 140 MG/ML SOAJ    4. Which pharmacy/location (including street and city if local pharmacy) is medication to be sent to?  [facility] - [place], [place] - [address] Phone: [phone]  Fax: [phone]       5. Do they need a 30 day or 90 day supply? 90   Pt states he is out, requesting it be sent to different pharmacy   "

## 2024-10-10 ENCOUNTER — Ambulatory Visit: Admitting: Student in an Organized Health Care Education/Training Program
